# Patient Record
Sex: Female | Born: 1970
Health system: Southern US, Community
[De-identification: ages and names within clinical notes are randomized; demographics above are authoritative.]

## PROBLEM LIST (undated history)

## (undated) DIAGNOSIS — N951 Menopausal and female climacteric states: Secondary | ICD-10-CM

## (undated) DIAGNOSIS — G43009 Migraine without aura, not intractable, without status migrainosus: Secondary | ICD-10-CM

## (undated) DIAGNOSIS — F5104 Psychophysiologic insomnia: Secondary | ICD-10-CM

## (undated) HISTORY — DX: Psychophysiologic insomnia: F51.04

## (undated) HISTORY — PX: OTHER SURGICAL HISTORY: SHX169

## (undated) HISTORY — PX: TUBAL LIGATION: SHX77

## (undated) HISTORY — DX: Menopausal and female climacteric states: N95.1

## (undated) HISTORY — DX: Migraine without aura, not intractable, without status migrainosus: G43.009

---

## 1999-01-26 ENCOUNTER — Other Ambulatory Visit: Admission: RE | Admit: 1999-01-26 | Discharge: 1999-01-26 | Payer: Self-pay | Admitting: Obstetrics and Gynecology

## 2000-02-03 ENCOUNTER — Other Ambulatory Visit: Admission: RE | Admit: 2000-02-03 | Discharge: 2000-02-03 | Payer: Self-pay | Admitting: Obstetrics and Gynecology

## 2001-03-02 ENCOUNTER — Other Ambulatory Visit: Admission: RE | Admit: 2001-03-02 | Discharge: 2001-03-02 | Payer: Self-pay | Admitting: Obstetrics and Gynecology

## 2002-02-19 ENCOUNTER — Other Ambulatory Visit: Admission: RE | Admit: 2002-02-19 | Discharge: 2002-02-19 | Payer: Self-pay | Admitting: Obstetrics and Gynecology

## 2004-05-22 ENCOUNTER — Emergency Department (HOSPITAL_COMMUNITY): Admission: EM | Admit: 2004-05-22 | Discharge: 2004-05-22 | Payer: Self-pay | Admitting: Emergency Medicine

## 2006-11-14 ENCOUNTER — Emergency Department (HOSPITAL_COMMUNITY): Admission: EM | Admit: 2006-11-14 | Discharge: 2006-11-14 | Payer: Self-pay | Admitting: Emergency Medicine

## 2007-04-27 ENCOUNTER — Emergency Department (HOSPITAL_COMMUNITY): Admission: EM | Admit: 2007-04-27 | Discharge: 2007-04-27 | Payer: Self-pay | Admitting: Emergency Medicine

## 2008-10-03 ENCOUNTER — Emergency Department (HOSPITAL_COMMUNITY): Admission: EM | Admit: 2008-10-03 | Discharge: 2008-10-03 | Payer: Self-pay | Admitting: Emergency Medicine

## 2009-04-11 LAB — HM MAMMOGRAPHY: HM Mammogram: NORMAL

## 2009-10-26 ENCOUNTER — Emergency Department (HOSPITAL_COMMUNITY): Admission: EM | Admit: 2009-10-26 | Discharge: 2009-10-26 | Payer: Self-pay | Admitting: Family Medicine

## 2010-05-13 ENCOUNTER — Ambulatory Visit: Payer: Self-pay | Admitting: Obstetrics and Gynecology

## 2010-06-26 LAB — POCT URINALYSIS DIP (DEVICE)
Protein, ur: 30 mg/dL — AB
Urobilinogen, UA: 0.2 mg/dL (ref 0.0–1.0)
pH: 6 (ref 5.0–8.0)

## 2010-06-26 LAB — WET PREP, GENITAL
Trich, Wet Prep: NONE SEEN
Yeast Wet Prep HPF POC: NONE SEEN

## 2010-12-30 LAB — COMPREHENSIVE METABOLIC PANEL
ALT: 18
Alkaline Phosphatase: 40
BUN: 13
CO2: 27
Chloride: 107
GFR calc non Af Amer: >60
Glucose, Bld: 100 — ABNORMAL HIGH
Potassium: 3.9
Sodium: 139
Total Bilirubin: 1.2

## 2010-12-30 LAB — URINALYSIS, ROUTINE W REFLEX MICROSCOPIC
Ketones, ur: NEGATIVE
Nitrite: NEGATIVE
Protein, ur: NEGATIVE
pH: 6

## 2010-12-30 LAB — URINE MICROSCOPIC-ADD ON

## 2010-12-30 LAB — DIFFERENTIAL
Basophils Absolute: 0
Basophils Relative: 1
Eosinophils Absolute: 0
Eosinophils Relative: 1
Lymphs Abs: 0.5 — ABNORMAL LOW

## 2010-12-30 LAB — CBC
Hemoglobin: 11.1 — ABNORMAL LOW
RBC: 3.63 — ABNORMAL LOW
WBC: 6.5

## 2010-12-30 LAB — PREGNANCY, URINE

## 2010-12-30 LAB — URINE CULTURE: Colony Count: 85000

## 2010-12-30 LAB — LIPASE, BLOOD: Lipase: 30

## 2011-04-12 LAB — HM PAP SMEAR: HM PAP: NORMAL

## 2014-11-28 ENCOUNTER — Ambulatory Visit (INDEPENDENT_AMBULATORY_CARE_PROVIDER_SITE_OTHER): Payer: Federal, State, Local not specified - PPO | Admitting: Family Medicine

## 2014-11-28 VITALS — BP 100/60 | HR 86 | Temp 98.7°F | Resp 14 | Ht 66.0 in | Wt 138.0 lb

## 2014-11-28 DIAGNOSIS — R300 Dysuria: Secondary | ICD-10-CM

## 2014-11-28 LAB — POCT URINALYSIS DIPSTICK
Bilirubin, UA: NEGATIVE
Glucose, UA: NEGATIVE
Nitrite, UA: NEGATIVE
PH UA: 7
PROTEIN UA: NEGATIVE
RBC UA: NEGATIVE
SPEC GRAV UA: 1.02
UROBILINOGEN UA: 1

## 2014-11-28 LAB — POCT UA - MICROSCOPIC ONLY
CASTS, UR, LPF, POC: NEGATIVE
Crystals, Ur, HPF, POC: NEGATIVE
MUCUS UA: NEGATIVE
RBC, urine, microscopic: NEGATIVE
YEAST UA: NEGATIVE

## 2014-11-28 MED ORDER — CIPROFLOXACIN HCL 500 MG PO TABS: 500.0000 mg | ORAL_TABLET | Freq: Two times a day (BID) | ORAL | Status: DC

## 2014-11-28 NOTE — Progress Notes (Addendum)
Subjective:    Patient ID: Tammy Evans, female    DOB: 1971/02/14, 44 y.o.   MRN: [id number]  HPI    Review of Systems     Objective:   Physical Exam        Assessment & Plan:   [facility] [address], [place] [address] [phone]  Date:  11/28/2014   Name:  Tammy Evans   DOB:  27-Apr-1970   MRN:  [id number]  PCP:  Ruel Favors, MD    Chief Complaint: Urinary Tract Infection   History of Present Illness:  Tammy Evans is a 44 y.o. very pleasant female patient who presents with the following:  She has noted left lower back pain off and on for a couple of weeks, and urinary frequency for about 2 weeks. She has noted mild dysuria off and on No blood in her urine No fever, no belly pain No vaginal sx She is generally in good health  She is eating normally  LMP 11/06/14- she is s/p BTL  There are no active problems to display for this patient.   History reviewed. No pertinent past medical history.  Past Surgical History  Procedure Laterality Date  . Cesarean section      Social History  Substance Use Topics  . Smoking status: Never Smoker   . Smokeless tobacco: None  . Alcohol Use: No    Family History  Problem Relation Age of Onset  . Diabetes Father   . Heart disease Father   . Hypertension Father     No Known Allergies  Medication list has been reviewed and updated.  No current outpatient prescriptions on file prior to visit.   No current facility-administered medications on file prior to visit.    Review of Systems:  As per HPI- otherwise negative.   Physical Examination: Filed Vitals:   11/28/14 1717  BP: 100/60  Pulse: 86  Temp: 99 F (37.2 C)  Resp: 14   Filed Vitals:   11/28/14 1717  Height: 5\' 6"  (1.676 m)  Weight: 138 lb (62.596 kg)   Body mass index is 22.28 kg/(m^2). Ideal Body Weight: Weight in (lb) to have BMI = 25: 154.6  GEN: WDWN, NAD, Non-toxic, A & O x  3 HEENT: Atraumatic, Normocephalic. Neck supple. No masses, No LAD. Ears and Nose: No external deformity. CV: RRR, No M/G/R. No JVD. No thrill. No extra heart sounds. PULM: CTA B, no wheezes, crackles, rhonchi. No retractions. No resp. distress. No accessory muscle use. ABD: S, NT, ND, +BS. No rebound. No HSM. Mild left sided CVA tenderness  EXTR: No c/c/e NEURO Normal gait.  PSYCH: Normally interactive. Conversant. Not depressed or anxious appearing.  Calm demeanor.   Results for orders placed or performed in visit on 11/28/14  POCT urinalysis dipstick  Result Value Ref Range   Color, UA yellow    Clarity, UA clear    Glucose, UA neg    Bilirubin, UA neg    Ketones, UA trace    Spec Grav, UA 1.020    Blood, UA neg    pH, UA 7.0    Protein, UA neg    Urobilinogen, UA 1.0    Nitrite, UA neg    Leukocytes, UA small (1+) (A) Negative  POCT UA - Microscopic Only  Result Value Ref Range   WBC, Ur, HPF, POC 4-6    RBC, urine, microscopic neg    Bacteria, U Microscopic  3+    Mucus, UA neg    Epithelial cells, urine per micros 4-6    Crystals, Ur, HPF, POC neg    Casts, Ur, LPF, POC neg    Yeast, UA neg     Assessment and Plan: Dysuria - Plan: POCT urinalysis dipstick, POCT UA - Microscopic Only, Urine culture, ciprofloxacin (CIPRO) 500 MG tablet  Treat for UTI- will use 500 mg cipro as she has some back pain- could be early pyelo Await culture, she will let me know if not better soon  Meds ordered this encounter  Medications  . ciprofloxacin (CIPRO) 500 MG tablet    Sig: Take 1 tablet (500 mg total) by mouth 2 (two) times daily.    Dispense:  10 tablet    Refill:  0   Signed Abbe Amsterdam, MD  Called with her urine culture results 8/21- LMOM. Mixed urine culture- let me know if not feeling better.  However if she is feeling better UTI is more likely, continue abx   Results for orders placed or performed in visit on 11/28/14  Urine culture  Result Value Ref Range    Colony Count 50,000 COLONIES/ML    Organism ID, Bacteria Multiple bacterial morphotypes present, none    Organism ID, Bacteria predominant. Suggest appropriate recollection if     Organism ID, Bacteria clinically indicated.   POCT urinalysis dipstick  Result Value Ref Range   Color, UA yellow    Clarity, UA clear    Glucose, UA neg    Bilirubin, UA neg    Ketones, UA trace    Spec Grav, UA 1.020    Blood, UA neg    pH, UA 7.0    Protein, UA neg    Urobilinogen, UA 1.0    Nitrite, UA neg    Leukocytes, UA small (1+) (A) Negative  POCT UA - Microscopic Only  Result Value Ref Range   WBC, Ur, HPF, POC 4-6    RBC, urine, microscopic neg    Bacteria, U Microscopic 3+    Mucus, UA neg    Epithelial cells, urine per micros 4-6    Crystals, Ur, HPF, POC neg    Casts, Ur, LPF, POC neg    Yeast, UA neg

## 2014-11-28 NOTE — Patient Instructions (Signed)
We are going to treat you for a UTI with cipro (antibiotic) Please let us know if you are not feeling better in the next 1-2 days I will be in touch if your urine culture shows any problem

## 2014-11-29 LAB — URINE CULTURE

## 2014-11-30 ENCOUNTER — Encounter: Payer: Self-pay | Admitting: Family Medicine

## 2015-01-13 ENCOUNTER — Ambulatory Visit (INDEPENDENT_AMBULATORY_CARE_PROVIDER_SITE_OTHER): Payer: Federal, State, Local not specified - PPO | Admitting: Family Medicine

## 2015-01-13 ENCOUNTER — Encounter: Payer: Self-pay | Admitting: Family Medicine

## 2015-01-13 ENCOUNTER — Encounter (INDEPENDENT_AMBULATORY_CARE_PROVIDER_SITE_OTHER): Payer: Self-pay

## 2015-01-13 VITALS — BP 106/64 | HR 74 | Temp 98.3°F | Resp 16 | Ht 66.0 in | Wt 138.9 lb

## 2015-01-13 DIAGNOSIS — Z79899 Other long term (current) drug therapy: Secondary | ICD-10-CM | POA: Diagnosis not present

## 2015-01-13 DIAGNOSIS — Z114 Encounter for screening for human immunodeficiency virus [HIV]: Secondary | ICD-10-CM | POA: Diagnosis not present

## 2015-01-13 DIAGNOSIS — Z124 Encounter for screening for malignant neoplasm of cervix: Secondary | ICD-10-CM

## 2015-01-13 DIAGNOSIS — Z01419 Encounter for gynecological examination (general) (routine) without abnormal findings: Secondary | ICD-10-CM

## 2015-01-13 DIAGNOSIS — Z0000 Encounter for general adult medical examination without abnormal findings: Secondary | ICD-10-CM | POA: Diagnosis not present

## 2015-01-13 DIAGNOSIS — Z13220 Encounter for screening for lipoid disorders: Secondary | ICD-10-CM | POA: Diagnosis not present

## 2015-01-13 DIAGNOSIS — R61 Generalized hyperhidrosis: Secondary | ICD-10-CM | POA: Diagnosis not present

## 2015-01-13 DIAGNOSIS — G4709 Other insomnia: Secondary | ICD-10-CM | POA: Insufficient documentation

## 2015-01-13 DIAGNOSIS — Z1239 Encounter for other screening for malignant neoplasm of breast: Secondary | ICD-10-CM

## 2015-01-13 DIAGNOSIS — N6029 Fibroadenosis of unspecified breast: Secondary | ICD-10-CM | POA: Diagnosis not present

## 2015-01-13 DIAGNOSIS — Z23 Encounter for immunization: Secondary | ICD-10-CM

## 2015-01-13 DIAGNOSIS — G4726 Circadian rhythm sleep disorder, shift work type: Secondary | ICD-10-CM | POA: Insufficient documentation

## 2015-01-13 DIAGNOSIS — G4700 Insomnia, unspecified: Secondary | ICD-10-CM | POA: Insufficient documentation

## 2015-01-13 DIAGNOSIS — G43009 Migraine without aura, not intractable, without status migrainosus: Secondary | ICD-10-CM | POA: Insufficient documentation

## 2015-01-13 NOTE — Patient Instructions (Signed)
Discussed importance of 150 minutes of physical activity weekly, eat two servings of fish weekly, eat one serving of tree nuts ( cashews, pistachios, pecans, almonds..) every other day, eat 6 servings of fruit/vegetables daily and drink plenty of water and avoid sweet beverages. 

## 2015-01-13 NOTE — Progress Notes (Signed)
Name: Tammy Evans   MRN: [id number]    DOB: December 13, 1970   Date:01/13/2015       Progress Note  Subjective  Chief Complaint  Chief Complaint  Patient presents with  . Annual Exam    HPI  Well Woman:  Denies urinary problems, no vaginal discharge, no dyspareunia, no nipple discharge or lumps. Feeling well excetp for some indigestion, but she wants to try otc mediations and return to discuss it at another visit  Patient Active Problem List   Diagnosis Date Noted  . Migraine without aura and responsive to treatment 01/13/2015  . Insomnia, persistent 01/13/2015  . Night sweats 01/13/2015  . Shift work sleep disorder 01/13/2015    Past Surgical History  Procedure Laterality Date  . Cesarean section    . Tubal ligation    . Culposcopy      Family History  Problem Relation Age of Onset  . Diabetes Father   . Heart disease Father   . Hypertension Father   . Drug abuse Sister   . Sickle cell trait Daughter     Social History   Social History  . Marital Status: Single    Spouse Name: N/A  . Number of Children: N/A  . Years of Education: N/A   Occupational History  . Not on file.   Social History Main Topics  . Smoking status: Never Smoker   . Smokeless tobacco: Never Used  . Alcohol Use: 0.0 oz/week    0 Standard drinks or equivalent per week     Comment: occasionally  . Drug Use: No  . Sexual Activity: Yes   Other Topics Concern  . Not on file   Social History Narrative     Current outpatient prescriptions:  .  SUMAtriptan (IMITREX) 100 MG tablet, Take by mouth., Disp: , Rfl:  .  temazepam (RESTORIL) 15 MG capsule, Take by mouth., Disp: , Rfl:   No Known Allergies   ROS  Constitutional: Negative for fever or weight change.  Respiratory: Negative for cough and shortness of breath.   Cardiovascular: Negative for chest pain or palpitations.  Gastrointestinal: Intermittent  abdominal pain, no bowel changes.  Musculoskeletal: Negative for gait problem  or joint swelling.  Skin: Negative for rash.  Neurological: Negative for dizziness or headache.  No other specific complaints in a complete review of systems (except as listed in HPI above).  Objective  Filed Vitals:   01/13/15 1432  BP: 106/64  Pulse: 74  Temp: 98.3 F (36.8 C)  TempSrc: Oral  Resp: 16  Height:  (1.676 m)  Weight: 138 lb 14.4 oz (63.005 kg)  SpO2: 98%    Body mass index is 22.43 kg/(m^2).  Physical Exam   Constitutional: Patient appears well-developed and well-nourished. No distress.  HENT: Head: Normocephalic and atraumatic. Ears: B TMs ok, no erythema or effusion; Nose: Nose normal. Mouth/Throat: Oropharynx is clear and moist. No oropharyngeal exudate.  Eyes: Conjunctivae and EOM are normal. Pupils are equal, round, and reactive to light. No scleral icterus.  Neck: Normal range of motion. Neck supple. No JVD present. No thyromegaly present.  Cardiovascular: Normal rate, regular rhythm and normal heart sounds.  No murmur heard. No BLE edema. Pulmonary/Chest: Effort normal and breath sounds normal. No respiratory distress. Abdominal: Soft. Bowel sounds are normal, no distension. There is no tenderness. no masses Breast: lumpy throughout  or masses, no nipple discharge or rashes FEMALE GENITALIA:  External genitalia normal External urethra normal Vaginal vault normal without discharge  or lesions Cervix normal without discharge or lesions Bimanual exam normal without masses RECTAL: not done Musculoskeletal: Normal range of motion, no joint effusions. No gross deformities Neurological: he is alert and oriented to person, place, and time. No cranial nerve deficit. Coordination, balance, strength, speech and gait are normal.  Skin: Skin is warm and dry. No rash noted. No erythema.  Psychiatric: Patient has a normal mood and affect. behavior is normal. Judgment and thought content normal.  Recent Results (from the past 2160 hour(s))  POCT urinalysis  dipstick     Status: Abnormal   Collection Time: 11/28/14  5:48 PM  Result Value Ref Range   Color, UA yellow    Clarity, UA clear    Glucose, UA neg    Bilirubin, UA neg    Ketones, UA trace    Spec Grav, UA 1.020    Blood, UA neg    pH, UA 7.0    Protein, UA neg    Urobilinogen, UA 1.0    Nitrite, UA neg    Leukocytes, UA small (1+) (A) Negative  POCT UA - Microscopic Only     Status: None   Collection Time: 11/28/14  5:48 PM  Result Value Ref Range   WBC, Ur, HPF, POC 4-6    RBC, urine, microscopic neg    Bacteria, U Microscopic 3+    Mucus, UA neg    Epithelial cells, urine per micros 4-6    Crystals, Ur, HPF, POC neg    Casts, Ur, LPF, POC neg    Yeast, UA neg   Urine culture     Status: None   Collection Time: 11/28/14  5:48 PM  Result Value Ref Range   Colony Count 50,000 COLONIES/ML    Organism ID, Bacteria Multiple bacterial morphotypes present, none    Organism ID, Bacteria predominant. Suggest appropriate recollection if     Organism ID, Bacteria clinically indicated.       PHQ2/9: Depression screen [facility] 2/9 01/13/2015 11/28/2014  Decreased Interest 0 0  Down, Depressed, Hopeless 0 0  PHQ - 2 Score 0 0     Fall Risk: Fall Risk  01/13/2015  Falls in the past year? No     Functional Status Survey: Is the patient deaf or have difficulty hearing?: No Does the patient have difficulty seeing, even when wearing glasses/contacts?: Yes (contacts) Does the patient have difficulty concentrating, remembering, or making decisions?: No Does the patient have difficulty walking or climbing stairs?: No Does the patient have difficulty dressing or bathing?: No Does the patient have difficulty doing errands alone such as visiting a doctor's office or shopping?: No    Assessment & Plan  1. Well woman exam  Discussed importance of 150 minutes of physical activity weekly, eat two servings of fish weekly, eat one serving of tree nuts ( cashews, pistachios, pecans,  almonds.Marland Kitchen) every other day, eat 6 servings of fruit/vegetables daily and drink plenty of water and avoid sweet beverages.   2. Night sweats  - CBC with Differential/Platelet - TSH  3. Cervical cancer screening  - PapLb, HPV, rfx16/18  4. Breast cancer screening  -she will have it done at work  5. Screening for HIV (human immunodeficiency virus)  - HIV antibody  6. Needs flu shot  - Flu Vaccine QUAD 36+ mos IM -refused  7. Lipid screening  - Lipid panel  8. Long-term use of high-risk medication  - Comprehensive metabolic panel  9. Lumpy breasts, unspecified laterality  Possible fibrocystic breast  disease, but no pain, she will get mammogram through work this month and call back if needed.

## 2015-01-16 LAB — COMPREHENSIVE METABOLIC PANEL
ALT: 12 IU/L (ref 0–32)
AST: 15 IU/L (ref 0–40)
Albumin/Globulin Ratio: 1.6 (ref 1.1–2.5)
Albumin: 4.2 g/dL (ref 3.5–5.5)
Alkaline Phosphatase: 48 IU/L (ref 39–117)
BUN/Creatinine Ratio: 14 (ref 9–23)
BUN: 13 mg/dL (ref 6–24)
Bilirubin Total: 1.1 mg/dL (ref 0.0–1.2)
CALCIUM: 9.1 mg/dL (ref 8.7–10.2)
CHLORIDE: 101 mmol/L (ref 97–108)
CO2: 24 mmol/L (ref 18–29)
CREATININE: 0.94 mg/dL (ref 0.57–1.00)
GFR, EST AFRICAN AMERICAN: 84 mL/min/{1.73_m2} (ref >59)
GFR, EST NON AFRICAN AMERICAN: 73 mL/min/{1.73_m2} (ref >59)
GLUCOSE: 86 mg/dL (ref 65–99)
Globulin, Total: 2.6 g/dL (ref 1.5–4.5)
Potassium: 4 mmol/L (ref 3.5–5.2)
Sodium: 138 mmol/L (ref 134–144)
TOTAL PROTEIN: 6.8 g/dL (ref 6.0–8.5)

## 2015-01-16 LAB — CBC WITH DIFFERENTIAL/PLATELET
BASOS ABS: 0 10*3/uL (ref 0.0–0.2)
Basos: 0 %
EOS (ABSOLUTE): 0.1 10*3/uL (ref 0.0–0.4)
Eos: 2 %
HEMOGLOBIN: 12.8 g/dL (ref 11.1–15.9)
Hematocrit: 38.5 % (ref 34.0–46.6)
IMMATURE GRANS (ABS): 0 10*3/uL (ref 0.0–0.1)
IMMATURE GRANULOCYTES: 0 %
LYMPHS: 24 %
Lymphocytes Absolute: 1.1 10*3/uL (ref 0.7–3.1)
MCH: 30.2 pg (ref 26.6–33.0)
MCHC: 33.2 g/dL (ref 31.5–35.7)
MCV: 91 fL (ref 79–97)
MONOCYTES: 11 %
Monocytes Absolute: 0.5 10*3/uL (ref 0.1–0.9)
NEUTROS PCT: 63 %
Neutrophils Absolute: 3 10*3/uL (ref 1.4–7.0)
PLATELETS: 138 10*3/uL — AB (ref 150–379)
RBC: 4.24 x10E6/uL (ref 3.77–5.28)
RDW: 14.2 % (ref 12.3–15.4)
WBC: 4.7 10*3/uL (ref 3.4–10.8)

## 2015-01-16 LAB — LIPID PANEL
CHOL/HDL RATIO: 3 ratio (ref 0.0–4.4)
Cholesterol, Total: 189 mg/dL (ref 100–199)
HDL: 64 mg/dL (ref >39)
LDL CALC: 109 mg/dL — AB (ref 0–99)
TRIGLYCERIDES: 79 mg/dL (ref 0–149)
VLDL CHOLESTEROL CAL: 16 mg/dL (ref 5–40)

## 2015-01-16 LAB — HIV ANTIBODY (ROUTINE TESTING W REFLEX): HIV Screen 4th Generation wRfx: NONREACTIVE

## 2015-01-16 LAB — TSH: TSH: 0.949 u[IU]/mL (ref 0.450–4.500)

## 2015-01-20 LAB — PAPLB, HPV, RFX16/18: PAP SMEAR COMMENT: 0

## 2015-01-20 NOTE — Progress Notes (Signed)
Left voice mail

## 2015-01-20 NOTE — Progress Notes (Signed)
Patient notified

## 2015-11-10 ENCOUNTER — Telehealth: Payer: Self-pay

## 2015-11-10 ENCOUNTER — Ambulatory Visit (INDEPENDENT_AMBULATORY_CARE_PROVIDER_SITE_OTHER): Payer: Federal, State, Local not specified - PPO | Admitting: Family Medicine

## 2015-11-10 ENCOUNTER — Encounter: Payer: Self-pay | Admitting: Family Medicine

## 2015-11-10 VITALS — Temp 97.6°F | Resp 16 | Ht 66.0 in | Wt 141.7 lb

## 2015-11-10 DIAGNOSIS — R350 Frequency of micturition: Secondary | ICD-10-CM

## 2015-11-10 DIAGNOSIS — F331 Major depressive disorder, recurrent, moderate: Secondary | ICD-10-CM | POA: Diagnosis not present

## 2015-11-10 DIAGNOSIS — F411 Generalized anxiety disorder: Secondary | ICD-10-CM | POA: Insufficient documentation

## 2015-11-10 DIAGNOSIS — M545 Low back pain, unspecified: Secondary | ICD-10-CM

## 2015-11-10 DIAGNOSIS — G43009 Migraine without aura, not intractable, without status migrainosus: Secondary | ICD-10-CM

## 2015-11-10 DIAGNOSIS — Z833 Family history of diabetes mellitus: Secondary | ICD-10-CM

## 2015-11-10 DIAGNOSIS — M79605 Pain in left leg: Secondary | ICD-10-CM

## 2015-11-10 DIAGNOSIS — Z113 Encounter for screening for infections with a predominantly sexual mode of transmission: Secondary | ICD-10-CM | POA: Diagnosis not present

## 2015-11-10 LAB — POCT URINALYSIS DIPSTICK
Bilirubin, UA: NEGATIVE
Blood, UA: NEGATIVE
Glucose, UA: NEGATIVE
KETONES UA: NEGATIVE
LEUKOCYTES UA: NEGATIVE
NITRITE UA: NEGATIVE
PH UA: 6
PROTEIN UA: NEGATIVE
Spec Grav, UA: 1.015
Urobilinogen, UA: 0.2

## 2015-11-10 LAB — POCT GLYCOSYLATED HEMOGLOBIN (HGB A1C): HEMOGLOBIN A1C: 5.4

## 2015-11-10 MED ORDER — ESCITALOPRAM OXALATE 10 MG PO TABS: 10.0000 mg | ORAL_TABLET | Freq: Every day | ORAL | 0 refills | Status: DC

## 2015-11-10 MED ORDER — SUMATRIPTAN SUCCINATE 100 MG PO TABS: 100.0000 mg | ORAL_TABLET | Freq: Once | ORAL | 2 refills | Status: DC

## 2015-11-10 MED ORDER — PREDNISONE 10 MG (48) PO TBPK: ORAL_TABLET | Freq: Every day | ORAL | 0 refills | Status: DC

## 2015-11-10 MED ORDER — TOPIRAMATE 25 MG PO TABS: 25.0000 mg | ORAL_TABLET | Freq: Two times a day (BID) | ORAL | 0 refills | Status: DC

## 2015-11-10 NOTE — Progress Notes (Signed)
Name: Tammy Evans   MRN: [id number]    DOB: 1970/04/21   Date:11/10/2015       Progress Note  Subjective  Chief Complaint  Chief Complaint  Patient presents with  . Medication Refill  . Migraine    Patient states she has been very stressed out and has ran out of medication. She has had 3-4 migraines monthly  . Back Pain    Onset-3 days, Left Flank pain, very tender and sore, patient also states she is experiencing urinary frequency with her flank pain and would like her urine checked.   . Anxiety    Patient states she has alot going on and to deal with and would like to talk about start medication due to stress    HPI  GAD/Major Depression: she has a history of depression and anxiety. She does not like to take medications, but states she is very stressed out now. Her boyfriend ( they live together ) is doing drugs - she found cocaine in their house ( he denied ), he is always gone and she knows he is doing drugs. She is getting very worried about it. She had a panic attack last week. She is feeling tired and snappy, migraines more often. Can't afford moving out of the house now, but making plans to be able to get her own place. Sister is in [place] but can't get a job transfer because she has missed too many days of work secondary to migraine  Migraine headaches: she states it is initially on right frontal area, sharp like and improves when she takes [facility] powders and sleeps. She has nausea and photophobia and phonophobia.   Back pain with radiculitis: she developed acute onset of left lower back pain that radiates up and down her leg ( lateral aspect ), pain is aggravated by movement and better with rest - but it causes worsening of stiffness. She works at [employer] and has to pull and lift objects all day.   Urinary frequency: she had a tubal ligation, uses condoms, but over the past month, she has noticed episode of urinary frequency, no dysuria , vaginal discharge on hematuria.      Patient Active Problem List   Diagnosis Date Noted  . GAD (generalized anxiety disorder) 11/10/2015  . Migraine without aura and responsive to treatment 01/13/2015  . Insomnia, persistent 01/13/2015  . Night sweats 01/13/2015  . Shift work sleep disorder 01/13/2015    Past Surgical History:  Procedure Laterality Date  . CESAREAN SECTION    . culposcopy    . TUBAL LIGATION      Family History  Problem Relation Age of Onset  . Diabetes Father   . Heart disease Father   . Hypertension Father   . Drug abuse Sister   . Sickle cell trait Daughter     Social History   Social History  . Marital status: Single    Spouse name: N/A  . Number of children: N/A  . Years of education: N/A   Occupational History  . Not on file.   Social History Main Topics  . Smoking status: Never Smoker  . Smokeless tobacco: Never Used  . Alcohol use 0.0 oz/week     Comment: occasionally  . Drug use: No  . Sexual activity: Yes    Birth control/ protection: Condom     Comment: Tubal Ligation    Other Topics Concern  . Not on file   Social History Narrative  . No  narrative on file     Current Outpatient Prescriptions:  .  SUMAtriptan (IMITREX) 100 MG tablet, Take 1 tablet (100 mg total) by mouth once., Disp: 10 tablet, Rfl: 2 .  escitalopram (LEXAPRO) 10 MG tablet, Take 1 tablet (10 mg total) by mouth daily., Disp: 30 tablet, Rfl: 0 .  predniSONE (STERAPRED UNI-PAK 48 TAB) 10 MG (48) TBPK tablet, Take by mouth daily., Disp: 48 tablet, Rfl: 0 .  topiramate (TOPAMAX) 25 MG tablet, Take 1-4 tablets (25-100 mg total) by mouth 2 (two) times daily., Disp: 100 tablet, Rfl: 0  No Known Allergies   ROS  Ten systems reviewed and is negative except as mentioned in HPI   Objective  Vitals:   11/10/15 1535  Resp: 16  Temp: 97.6 F (36.4 C)  TempSrc: Oral  SpO2: 99%  Weight: 141 lb 11.2 oz (64.3 kg)  Height:  (1.676 m)    Body mass index is 22.87 kg/m.  Physical  Exam  Constitutional: Patient appears well-developed and well-nourished.  In mild  Distress when moving around  HEENT: head atraumatic, normocephalic, pupils equal and reactive to light,  neck supple, throat within normal limits Cardiovascular: Normal rate, regular rhythm and normal heart sounds.  No murmur heard. No BLE edema. Pulmonary/Chest: Effort normal and breath sounds normal. No respiratory distress. Abdominal: Soft.  There is no tenderness. Negative CVA tenderness Psychiatric: Patient has a normal mood and affect. behavior is normal. Judgment and thought content normal. Muscular Skeletal: pain with palpation of left lower back, negative straight leg raise, decrease rom of back in all directions.   Recent Results (from the past 2160 hour(s))  POCT urinalysis dipstick     Status: Normal   Collection Time: 11/10/15  3:44 PM  Result Value Ref Range   Color, UA yellow    Clarity, UA clear    Glucose, UA neg    Bilirubin, UA neg    Ketones, UA neg    Spec Grav, UA 1.015    Blood, UA neg    pH, UA 6.0    Protein, UA neg    Urobilinogen, UA 0.2    Nitrite, UA neg    Leukocytes, UA Negative Negative     PHQ2/9: Depression screen [facility] 2/9 11/10/2015 01/13/2015 11/28/2014  Decreased Interest 2 0 0  Down, Depressed, Hopeless 1 0 0  PHQ - 2 Score 3 0 0  Altered sleeping 3 - -  Tired, decreased energy 1 - -  Change in appetite 0 - -  Feeling bad or failure about yourself  0 - -  Trouble concentrating 2 - -  Moving slowly or fidgety/restless 2 - -  Suicidal thoughts 0 - -  PHQ-9 Score 11 - -  Difficult doing work/chores Somewhat difficult - -    Fall Risk: Fall Risk  11/10/2015 01/13/2015  Falls in the past year? No No      Functional Status Survey: Is the patient deaf or have difficulty hearing?: No Does the patient have difficulty seeing, even when wearing glasses/contacts?: No Does the patient have difficulty concentrating, remembering, or making decisions?: No Does the  patient have difficulty walking or climbing stairs?: No Does the patient have difficulty dressing or bathing?: No Does the patient have difficulty doing errands alone such as visiting a doctor's office or shopping?: No    Assessment & Plan  1. Urinary frequency  - POCT urinalysis dipstick - CULTURE, URINE COMPREHENSIVE - Chlamydia/Gonococcus/Trichomonas, NAA - hgbA1C  2. Low back pain radiating  to left lower extremity  - POCT urinalysis dipstick - predniSONE (STERAPRED UNI-PAK 48 TAB) 10 MG (48) TBPK tablet; Take by mouth daily.  Dispense: 48 tablet; Refill: 0  3. Migraine without aura and responsive to treatment  - SUMAtriptan (IMITREX) 100 MG tablet; Take 1 tablet (100 mg total) by mouth once.  Dispense: 10 tablet; Refill: 2 - topiramate (TOPAMAX) 25 MG tablet; Take 1-4 tablets (25-100 mg total) by mouth 2 (two) times daily.  Dispense: 100 tablet; Refill: 0  4. GAD (generalized anxiety disorder)  - escitalopram (LEXAPRO) 10 MG tablet; Take 1 tablet (10 mg total) by mouth daily.  Dispense: 30 tablet; Refill: 0  5. Routine screening for STI (sexually transmitted infection)  - Chlamydia/Gonococcus/Trichomonas, NAA - RPR - HIV antibody

## 2015-11-10 NOTE — Telephone Encounter (Signed)
Patient requesting refill. Valtrex Refill

## 2015-11-11 ENCOUNTER — Other Ambulatory Visit: Payer: Self-pay | Admitting: Family Medicine

## 2015-11-11 DIAGNOSIS — R350 Frequency of micturition: Secondary | ICD-10-CM | POA: Diagnosis not present

## 2015-11-11 DIAGNOSIS — Z113 Encounter for screening for infections with a predominantly sexual mode of transmission: Secondary | ICD-10-CM | POA: Diagnosis not present

## 2015-11-11 LAB — RPR: RPR: NONREACTIVE

## 2015-11-11 LAB — HIV ANTIBODY (ROUTINE TESTING W REFLEX): HIV Screen 4th Generation wRfx: NONREACTIVE

## 2015-11-11 MED ORDER — VALACYCLOVIR HCL 500 MG PO TABS: 500.0000 mg | ORAL_TABLET | Freq: Every day | ORAL | 0 refills | Status: DC

## 2015-11-13 ENCOUNTER — Ambulatory Visit: Payer: Federal, State, Local not specified - PPO

## 2015-11-13 LAB — CHLAMYDIA/GONOCOCCUS/TRICHOMONAS, NAA
Chlamydia by NAA: NEGATIVE
Gonococcus by NAA: NEGATIVE
Trich vag by NAA: NEGATIVE

## 2015-11-13 LAB — PLEASE NOTE

## 2015-11-15 LAB — PLEASE NOTE

## 2015-11-15 LAB — CULTURE, URINE COMPREHENSIVE

## 2015-11-16 ENCOUNTER — Other Ambulatory Visit: Payer: Self-pay | Admitting: Family Medicine

## 2015-11-16 MED ORDER — CIPROFLOXACIN HCL 250 MG PO TABS: 250.0000 mg | ORAL_TABLET | Freq: Two times a day (BID) | ORAL | 0 refills | Status: DC

## 2015-11-18 ENCOUNTER — Telehealth: Payer: Self-pay

## 2015-11-18 NOTE — Telephone Encounter (Signed)
Left voicemail for patient to return my call for lab results.

## 2015-11-18 NOTE — Telephone Encounter (Signed)
-----   Message from Alba CoryKrichna Sowles, MD sent at 11/16/2015  7:48 PM EDT ----- She has a mild UTI, negative for genprobe Negative test for HIV and syphillis

## 2015-12-10 ENCOUNTER — Other Ambulatory Visit: Payer: Self-pay | Admitting: Family Medicine

## 2015-12-10 DIAGNOSIS — F411 Generalized anxiety disorder: Secondary | ICD-10-CM

## 2016-01-05 ENCOUNTER — Ambulatory Visit (INDEPENDENT_AMBULATORY_CARE_PROVIDER_SITE_OTHER): Payer: Federal, State, Local not specified - PPO | Admitting: Family Medicine

## 2016-01-05 ENCOUNTER — Encounter: Payer: Self-pay | Admitting: Family Medicine

## 2016-01-05 VITALS — BP 104/68 | HR 80 | Temp 98.0°F | Resp 16 | Ht 66.0 in | Wt 140.5 lb

## 2016-01-05 DIAGNOSIS — G43009 Migraine without aura, not intractable, without status migrainosus: Secondary | ICD-10-CM | POA: Diagnosis not present

## 2016-01-05 DIAGNOSIS — D696 Thrombocytopenia, unspecified: Secondary | ICD-10-CM | POA: Insufficient documentation

## 2016-01-05 DIAGNOSIS — Z1239 Encounter for other screening for malignant neoplasm of breast: Secondary | ICD-10-CM | POA: Diagnosis not present

## 2016-01-05 DIAGNOSIS — Z0000 Encounter for general adult medical examination without abnormal findings: Secondary | ICD-10-CM

## 2016-01-05 DIAGNOSIS — Z124 Encounter for screening for malignant neoplasm of cervix: Secondary | ICD-10-CM

## 2016-01-05 DIAGNOSIS — Z01419 Encounter for gynecological examination (general) (routine) without abnormal findings: Secondary | ICD-10-CM

## 2016-01-05 MED ORDER — AMITRIPTYLINE HCL 10 MG PO TABS: 10.0000 mg | ORAL_TABLET | Freq: Every day | ORAL | 1 refills | Status: DC

## 2016-01-05 NOTE — Progress Notes (Signed)
Name: Tammy Evans   MRN: [id number]    DOB: [birth year]   Date:01/05/2016       Progress Note  Subjective  Chief Complaint  Chief Complaint  Patient presents with  . Annual Exam    HPI  Well Woman: she is sexually active, same partner for the past 4 years, no bladder problems at this time, recent STI panel negative.  Migraine headaches: she does not like taking daily medications for migraines, she did not take Topamax as prescribed, discussed importance of taking preventive medication to decrease number of days that she misses from work. She is missing two daily month because of headaches. She states pain is described as throbbing, on right frontal area behind right eye, Imitrex helps but usually has to sleep it off. She has associated nausea, occasional vomiting and also has photophobia. She needs FMLA forms filled out.    Patient Active Problem List   Diagnosis Date Noted  . Thrombocytopenia (HCC) 01/05/2016  . GAD (generalized anxiety disorder) 11/10/2015  . Migraine without aura and responsive to treatment 01/13/2015  . Insomnia, persistent 01/13/2015  . Night sweats 01/13/2015  . Shift work sleep disorder 01/13/2015    Past Surgical History:  Procedure Laterality Date  . CESAREAN SECTION    . culposcopy    . TUBAL LIGATION      Family History  Problem Relation Age of Onset  . Diabetes Father   . Heart disease Father   . Hypertension Father   . Drug abuse Sister   . Sickle cell trait Daughter     Social History   Social History  . Marital status: Single    Spouse name: N/A  . Number of children: N/A  . Years of education: N/A   Occupational History  . postal service employee    Social History Main Topics  . Smoking status: Never Smoker  . Smokeless tobacco: Never Used  . Alcohol use 0.0 oz/week     Comment: occasionally  . Drug use: No  . Sexual activity: Yes    Birth control/ protection: Condom     Comment: Tubal Ligation    Other Topics Concern   . 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  valACYclovir (VALTREX) 500 MG tablet, Take 1 tablet (500 mg total) by mouth daily. And TID for outbreaks, Disp: 35 tablet, Rfl: 0 .  amitriptyline (ELAVIL) 10 MG tablet, Take 1 tablet (10 mg total) by mouth at bedtime., Disp: 90 tablet, Rfl: 1 .  SUMAtriptan (IMITREX) 100 MG tablet, Take 1 tablet (100 mg total) by mouth once., Disp: 10 tablet, Rfl: 2  No Known Allergies   ROS  Constitutional: Negative for fever or weight change.  Respiratory: Negative for cough and shortness of breath.   Cardiovascular: Negative for chest pain or palpitations.  Gastrointestinal: Negative for abdominal pain, no bowel changes.  Musculoskeletal: Negative for gait problem or joint swelling.  Skin: Negative for rash.  Neurological: Negative for dizziness or headache.  No other specific complaints in a complete review of systems (except as listed in HPI above).  Objective  Vitals:   01/05/16 1417  BP: 104/68  Pulse: 80  Resp: 16  Temp: 98 F (36.7 C)  SpO2: 97%  Weight: 140 lb 8 oz (63.7 kg)  Height:  5\' 6"  (1.676 m)    Body mass index is 22.68 kg/m.  Physical Exam  Constitutional: Patient appears well-developed and well-nourished. No distress.  HENT: Head: Normocephalic and atraumatic. Ears: B TMs ok, no erythema or effusion; Nose: Nose normal. Mouth/Throat: Oropharynx is clear and moist. No oropharyngeal exudate.  Eyes: Conjunctivae and EOM are normal. Pupils are equal, round, and reactive to light. No scleral icterus.  Neck: Normal range of motion. Neck supple. No JVD present. No thyromegaly present.  Cardiovascular: Normal rate, regular rhythm and normal heart sounds.  No murmur heard. No BLE edema. Pulmonary/Chest: Effort normal and breath sounds normal.  No respiratory distress. Abdominal: Soft. Bowel sounds are normal, no distension. There is no tenderness. no masses Breast: no lumps or masses, no nipple discharge or rashes FEMALE GENITALIA:  External genitalia normal External urethra normal On her cycle at this time RECTAL: not done Musculoskeletal: Normal range of motion, no joint effusions. No gross deformities Neurological: he is alert and oriented to person, place, and time. No cranial nerve deficit. Coordination, balance, strength, speech and gait are normal.  Skin: Skin is warm and dry. No rash noted. No erythema.  Psychiatric: Patient has a normal mood and affect. behavior is normal. Judgment and thought content normal.   Recent Results (from the past 2160 hour(s))  POCT urinalysis dipstick     Status: Normal   Collection Time: 11/10/15  3:44 PM  Result Value Ref Range   Color, UA yellow    Clarity, UA clear    Glucose, UA neg    Bilirubin, UA neg    Ketones, UA neg    Spec Grav, UA 1.015    Blood, UA neg    pH, UA 6.0    Protein, UA neg    Urobilinogen, UA 0.2    Nitrite, UA neg    Leukocytes, UA Negative Negative  POCT HgB A1C     Status: Normal   Collection Time: 11/10/15  4:39 PM  Result Value Ref Range   Hemoglobin A1C 5.4   RPR     Status: None   Collection Time: 11/10/15  4:43 PM  Result Value Ref Range   RPR Ser Ql Non Reactive Non Reactive  HIV antibody (with reflex)     Status: None   Collection Time: 11/10/15  4:43 PM  Result Value Ref Range   HIV Screen 4th Generation wRfx Non Reactive Non Reactive  CULTURE, URINE COMPREHENSIVE     Status: None   Collection Time: 11/11/15 12:00 AM  Result Value Ref Range   Urine Culture, Comprehensive Final report    Result 1 Lactobacillus species     Comment: 25,000-50,000 colony forming units per mL Susceptibility not normally performed on this organism.   Chlamydia/Gonococcus/Trichomonas, NAA     Status: None   Collection Time: 11/11/15 12:00 AM  Result  Value Ref Range   Chlamydia by NAA Negative Negative   Gonococcus by NAA Negative Negative   Trich vag by NAA Negative Negative  Please Note     Status: None   Collection Time: 11/11/15 12:00 AM  Result Value Ref Range   Please note Comment     Comment: The date and/or time of collection was not indicated on the requisition as required by state and federal law.  The date of receipt of the specimen was used as the collection date if not supplied.   Please Note     Status: None   Collection Time: 11/11/15 12:00 AM  Result  Value Ref Range   Please note Comment     Comment: The date and/or time of collection was not indicated on the requisition as required by state and federal law.  The date of receipt of the specimen was used as the collection date if not supplied.      PHQ2/9: Depression screen [facility] 2/9 01/05/2016 11/10/2015 01/13/2015 11/28/2014  Decreased Interest 0 2 0 0  Down, Depressed, Hopeless 0 1 0 0  PHQ - 2 Score 0 3 0 0  Altered sleeping - 3 - -  Tired, decreased energy - 1 - -  Change in appetite - 0 - -  Feeling bad or failure about yourself  - 0 - -  Trouble concentrating - 2 - -  Moving slowly or fidgety/restless - 2 - -  Suicidal thoughts - 0 - -  PHQ-9 Score - 11 - -  Difficult doing work/chores - Somewhat difficult - -     Fall Risk: Fall Risk  01/05/2016 11/10/2015 01/13/2015  Falls in the past year? No No No     Functional Status Survey: Is the patient deaf or have difficulty hearing?: No Does the patient have difficulty seeing, even when wearing glasses/contacts?: No Does the patient have difficulty concentrating, remembering, or making decisions?: No Does the patient have difficulty walking or climbing stairs?: No Does the patient have difficulty dressing or bathing?: No Does the patient have difficulty doing errands alone such as visiting a doctor's office or shopping?: No    Assessment & Plan  1. Well woman exam  Discussed importance of 150  minutes of physical activity weekly, eat two servings of fish weekly, eat one serving of tree nuts ( cashews, pistachios, pecans, almonds.Marland Kitchen) every other day, eat 6 servings of fruit/vegetables daily and drink plenty of water and avoid sweet beverages.   2. Cervical cancer screening  Due for repeat in 2019  3. Breast cancer screening  - MM Digital Screening; Future  4. Migraine without aura and responsive to treatment  - amitriptyline (ELAVIL) 10 MG tablet; Take 1 tablet (10 mg total) by mouth at bedtime.  Dispense: 90 tablet; Refill: 1

## 2016-07-04 ENCOUNTER — Ambulatory Visit: Payer: Federal, State, Local not specified - PPO | Admitting: Family Medicine

## 2016-07-05 ENCOUNTER — Encounter: Payer: Self-pay | Admitting: Family Medicine

## 2016-07-05 ENCOUNTER — Ambulatory Visit (INDEPENDENT_AMBULATORY_CARE_PROVIDER_SITE_OTHER): Payer: Federal, State, Local not specified - PPO | Admitting: Family Medicine

## 2016-07-05 VITALS — BP 104/64 | HR 59 | Temp 98.0°F | Resp 16 | Wt 145.1 lb

## 2016-07-05 DIAGNOSIS — Z79899 Other long term (current) drug therapy: Secondary | ICD-10-CM | POA: Diagnosis not present

## 2016-07-05 DIAGNOSIS — F325 Major depressive disorder, single episode, in full remission: Secondary | ICD-10-CM | POA: Diagnosis not present

## 2016-07-05 DIAGNOSIS — R1013 Epigastric pain: Secondary | ICD-10-CM | POA: Diagnosis not present

## 2016-07-05 DIAGNOSIS — Z13220 Encounter for screening for lipoid disorders: Secondary | ICD-10-CM

## 2016-07-05 DIAGNOSIS — D696 Thrombocytopenia, unspecified: Secondary | ICD-10-CM

## 2016-07-05 DIAGNOSIS — G43009 Migraine without aura, not intractable, without status migrainosus: Secondary | ICD-10-CM | POA: Diagnosis not present

## 2016-07-05 LAB — CBC WITH DIFFERENTIAL/PLATELET
BASOS ABS: 35 {cells}/uL (ref 0–200)
Basophils Relative: 1 %
EOS ABS: 70 {cells}/uL (ref 15–500)
Eosinophils Relative: 2 %
HEMATOCRIT: 38.3 % (ref 35.0–45.0)
Hemoglobin: 12.3 g/dL (ref 11.7–15.5)
Lymphocytes Relative: 30 %
Lymphs Abs: 1050 cells/uL (ref 850–3900)
MCH: 29.7 pg (ref 27.0–33.0)
MCHC: 32.1 g/dL (ref 32.0–36.0)
MCV: 92.5 fL (ref 80.0–100.0)
MONO ABS: 420 {cells}/uL (ref 200–950)
MONOS PCT: 12 %
MPV: 10.5 fL (ref 7.5–12.5)
NEUTROS ABS: 1925 {cells}/uL (ref 1500–7800)
Neutrophils Relative %: 55 %
PLATELETS: 152 10*3/uL (ref 140–400)
RBC: 4.14 MIL/uL (ref 3.80–5.10)
RDW: 13.9 % (ref 11.0–15.0)
WBC: 3.5 10*3/uL — ABNORMAL LOW (ref 3.8–10.8)

## 2016-07-05 MED ORDER — OMEPRAZOLE 20 MG PO CPDR: 20.0000 mg | DELAYED_RELEASE_CAPSULE | Freq: Every day | ORAL | 3 refills | Status: DC

## 2016-07-05 NOTE — Patient Instructions (Signed)
Food Choices for Gastroesophageal Reflux Disease, Adult When you have gastroesophageal reflux disease (GERD), the foods you eat and your eating habits are very important. Choosing the right foods can help ease the discomfort of GERD. Consider working with a diet and nutrition specialist (dietitian) to help you make healthy food choices. What general guidelines should I follow? Eating plan   Choose healthy foods low in fat, such as fruits, vegetables, whole grains, low-fat dairy products, and lean meat, fish, and poultry.  Eat frequent, small meals instead of three large meals each day. Eat your meals slowly, in a relaxed setting. Avoid bending over or lying down until 2-3 hours after eating.  Limit high-fat foods such as fatty meats or fried foods.  Limit your intake of oils, butter, and shortening to less than 8 teaspoons each day.  Avoid the following:  Foods that cause symptoms. These may be different for different people. Keep a food diary to keep track of foods that cause symptoms.  Alcohol.  Drinking large amounts of liquid with meals.  Eating meals during the 2-3 hours before bed.  Cook foods using methods other than frying. This may include baking, grilling, or broiling. Lifestyle    Maintain a healthy weight. Ask your health care provider what weight is healthy for you. If you need to lose weight, work with your health care provider to do so safely.  Exercise for at least 30 minutes on 5 or more days each week, or as told by your health care provider.  Avoid wearing clothes that fit tightly around your waist and chest.  Do not use any products that contain nicotine or tobacco, such as cigarettes and e-cigarettes. If you need help quitting, ask your health care provider.  Sleep with the head of your bed raised. Use a wedge under the mattress or blocks under the bed frame to raise the head of the bed. What foods are not recommended? The items listed may not be a complete  list. Talk with your dietitian about what dietary choices are best for you. Grains  Pastries or quick breads with added fat. French toast. Vegetables  Deep fried vegetables. French fries. Any vegetables prepared with added fat. Any vegetables that cause symptoms. For some people this may include tomatoes and tomato products, chili peppers, onions and garlic, and horseradish. Fruits  Any fruits prepared with added fat. Any fruits that cause symptoms. For some people this may include citrus fruits, such as oranges, grapefruit, pineapple, and lemons. Meats and other protein foods  High-fat meats, such as fatty beef or pork, hot dogs, ribs, ham, sausage, salami and bacon. Fried meat or protein, including fried fish and fried chicken. Nuts and nut butters. Dairy  Whole milk and chocolate milk. Sour cream. Cream. Ice cream. Cream cheese. Milk shakes. Beverages  Coffee and tea, with or without caffeine. Carbonated beverages. Sodas. Energy drinks. Fruit juice made with acidic fruits (such as orange or grapefruit). Tomato juice. Alcoholic drinks. Fats and oils  Butter. Margarine. Shortening. Ghee. Sweets and desserts  Chocolate and cocoa. Donuts. Seasoning and other foods  Pepper. Peppermint and spearmint. Any condiments, herbs, or seasonings that cause symptoms. For some people, this may include curry, hot sauce, or vinegar-based salad dressings. Summary  When you have gastroesophageal reflux disease (GERD), food and lifestyle choices are very important to help ease the discomfort of GERD.  Eat frequent, small meals instead of three large meals each day. Eat your meals slowly, in a relaxed setting. Avoid bending over   or lying down until 2-3 hours after eating.  Limit high-fat foods such as fatty meat or fried foods. This information is not intended to replace advice given to you by your health care provider. Make sure you discuss any questions you have with your health care provider. Document  Released: 03/28/2005 Document Revised: 03/29/2016 Document Reviewed: 03/29/2016 Elsevier Interactive Patient Education  2017 Elsevier Inc.   Gastritis, Adult Gastritis is inflammation of the stomach. There are two kinds of gastritis:  Acute gastritis. This kind develops suddenly.  Chronic gastritis. This kind lasts for a long time. Gastritis happens when the lining of the stomach becomes weak or gets damaged. Without treatment, gastritis can lead to stomach bleeding and ulcers. What are the causes? This condition may be caused by:  An infection.  Drinking too much alcohol.  Certain medicines.  Having too much acid in the stomach.  A disease of the intestines or stomach.  Stress. What are the signs or symptoms? Symptoms of this condition include:  Pain or a burning in the upper abdomen.  Nausea.  Vomiting.  An uncomfortable feeling of fullness after eating. In some cases, there are no symptoms. How is this diagnosed? This condition may be diagnosed with:  A description of your symptoms.  A physical exam.  Tests. These can include:  Blood tests.  Stool tests.  A test in which a thin, flexible instrument with a light and camera on the end is passed down the esophagus and into the stomach (upper endoscopy).  A test in which a sample of tissue is taken for testing (biopsy). How is this treated? This condition may be treated with medicines. If the condition is caused by a bacterial infection, you may be given antibiotic medicines. If it is caused by too much acid in the stomach, you may get medicines called H2 blockers, proton pump inhibitors, or antacids. Treatment may also involve stopping the use of certain medicines, such as aspirin, ibuprofen, or other nonsteroidal anti-inflammatory drugs (NSAIDs). Follow these instructions at home:  Take over-the-counter and prescription medicines only as told by your health care provider.  If you were prescribed an  antibiotic, take it as told by your health care provider. Do not stop taking the antibiotic even if you start to feel better.  Drink enough fluid to keep your urine clear or pale yellow.  Eat small, frequent meals instead of large meals. Contact a health care provider if:  Your symptoms get worse.  Your symptoms return after treatment. Get help right away if:  You vomit blood or material that looks like coffee grounds.  You have black or dark red stools.  You are unable to keep fluids down.  Your abdominal pain gets worse.  You have a fever.  You do not feel better after 1 week. This information is not intended to replace advice given to you by your health care provider. Make sure you discuss any questions you have with your health care provider. Document Released: 03/22/2001 Document Revised: 11/25/2015 Document Reviewed: 12/20/2014 Elsevier Interactive Patient Education  2017 Elsevier Inc.  

## 2016-07-05 NOTE — Progress Notes (Signed)
Name: Tammy Evans   MRN: [id number]    DOB: 21-Nov-1970   Date:07/05/2016       Progress Note  Subjective  Chief Complaint  Chief Complaint  Patient presents with  . Migraine    6 month follow up last one was Sun-Mon    HPI  Migraine headaches: she states it is initially on right frontal area, sharp like and improves when she takes [facility] powders and sleeps. She has nausea and photophobia and phonophobia. I gave her a prescription for Imitrex and Elavil, but stopped taking it because of stomach issues. Imitrex seems to help better than BC powders. She never started prophylactic medication. She is still missing a couple of times a month secondary to migraine episodes.   Epigastric pain: on and off for the past few years, but getting progressively worse and over the past 3 months has daily symptoms and much more severe than before. Worse after she eats, described as burning, sometimes sharp pain. Improves with Tums and Ranitidine. She states alcohol makes it much worse and she stopped drinking ( she states only drinks occasionally ) Appetite is normal, no nausea or vomiting, no weight loss, she has noticed that she takes Pepto Bismol and gets dark stools and at times feels constipated.   Major Depression: she states she is feeling better, no sadness, crying spells or problem sleeping.   Thrombocytopenia: she agrees in having repeat lab work   Patient Active Problem List   Diagnosis Date Noted  . Thrombocytopenia (HCC) 01/05/2016  . GAD (generalized anxiety disorder) 11/10/2015  . Migraine without aura and responsive to treatment 01/13/2015  . Insomnia, persistent 01/13/2015  . Night sweats 01/13/2015  . Shift work sleep disorder 01/13/2015    Past Surgical History:  Procedure Laterality Date  . CESAREAN SECTION    . culposcopy    . TUBAL LIGATION      Family History  Problem Relation Age of Onset  . Diabetes Father   . Heart disease Father   . Hypertension Father   . Drug  abuse Sister   . Sickle cell trait Daughter     Social History   Social History  . Marital status: Single    Spouse name: N/A  . Number of children: N/A  . Years of education: N/A   Occupational History  . postal service employee    Social History Main Topics  . Smoking status: Never Smoker  . Smokeless tobacco: Never Used  . Alcohol use 0.0 oz/week     Comment: occasionally  . Drug use: No  . Sexual activity: Yes    Birth control/ protection: Condom     Comment: Tubal Ligation    Other Topics Concern  . 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  amitriptyline (ELAVIL) 10 MG tablet, Take 1 tablet (10 mg total) by mouth at bedtime., Disp: 90 tablet, Rfl: 1 .  omeprazole (PRILOSEC) 20 MG capsule, Take 1 capsule (20 mg total) by mouth daily., Disp: 30 capsule, Rfl: 3 .  SUMAtriptan (IMITREX) 100 MG tablet, Take 1 tablet (100 mg total) by mouth once., Disp: 10 tablet, Rfl: 2 .  valACYclovir (VALTREX) 500 MG tablet, Take 1 tablet (500 mg total) by mouth daily. And TID for outbreaks, Disp: 35 tablet, Rfl: 0  No Known Allergies   ROS  Constitutional: Negative for fever or weight change.  Respiratory: Negative for cough and shortness of breath.   Cardiovascular: Negative for chest pain or palpitations.  Gastrointestinal: Positive for abdominal pain, positive  bowel changes - she notices constipation but has been taking peptobismol.  Musculoskeletal: Negative for gait problem or joint swelling.  Skin: Negative for rash.  Neurological: Negative for dizziness or headache.  No other specific complaints in a complete review of systems (except as listed in HPI above).  Objective  Vitals:   07/05/16 1320  BP: 104/64  Pulse: (!) 59  Resp: 16  Temp: 98 F (36.7 C)  SpO2: 99%  Weight: 145  lb 1 oz (65.8 kg)    Body mass index is 23.41 kg/m.  Physical Exam  Constitutional: Patient appears well-developed and well-nourished.  No distress.  HEENT: head atraumatic, normocephalic, pupils equal and reactive to light,  neck supple, throat within normal limits Cardiovascular: Normal rate, regular rhythm and normal heart sounds.  No murmur heard. No BLE edema. Pulmonary/Chest: Effort normal and breath sounds normal. No respiratory distress. Abdominal: Soft.  There is mild diffuse  tenderness.  Psychiatric: Patient has a normal mood and affect. behavior is normal. Judgment and thought content normal.  PHQ2/9: [facility] screen [facility] 2/9 01/05/2016 11/10/2015 01/13/2015 11/28/2014  Decreased Interest 0 2 0 0  Down, Depressed, Hopeless 0 1 0 0  PHQ - 2 Score 0 3 0 0  Altered sleeping - 3 - -  Tired, decreased energy - 1 - -  Change in appetite - 0 - -  Feeling bad or failure about yourself  - 0 - -  Trouble concentrating - 2 - -  Moving slowly or fidgety/restless - 2 - -  Suicidal thoughts - 0 - -  PHQ-9 Score - 11 - -  Difficult doing work/chores - Somewhat difficult - -     Fall Risk: Fall Risk  01/05/2016 11/10/2015 01/13/2015  Falls in the past year? No No No      Assessment & Plan  1. Migraine without aura and responsive to treatment  She stopped taking Elavil because of dyspepsia. She has missed at most 2 days per month secondary of migraine and needs FMLA forms filled out  2. Major depression in remission [facility](HCC)  Doing well at this time  3. Thrombocytopenia (HCC)  - CBC with Differential/Platelet  4. Long-term use of high-risk medication  - COMPLETE METABOLIC PANEL WITH GFR  5. Lipid screening  - Lipid panel  6. Epigastric pain  Discussed possible gastritis versus GERD. We will check h. Pylori , change diet and if needed refer to GI - H. pylori breath test - omeprazole (PRILOSEC) 20 MG capsule; Take 1 capsule (20 mg total) by mouth daily.  Dispense: 30  capsule; Refill: 3  7. Dyspepsia  - H. pylori breath test - omeprazole (PRILOSEC) 20 MG capsule; Take 1 capsule (20 mg total) by mouth daily.  Dispense: 30 capsule; Refill: 3

## 2016-07-06 LAB — LIPID PANEL
CHOLESTEROL: 170 mg/dL (ref <200)
HDL: 56 mg/dL (ref >50)
LDL Cholesterol: 103 mg/dL — ABNORMAL HIGH (ref <100)
TRIGLYCERIDES: 57 mg/dL (ref <150)
Total CHOL/HDL Ratio: 3 Ratio (ref <5.0)
VLDL: 11 mg/dL (ref <30)

## 2016-07-06 LAB — COMPLETE METABOLIC PANEL WITH GFR
ALBUMIN: 4 g/dL (ref 3.6–5.1)
ALK PHOS: 39 U/L (ref 33–115)
ALT: 10 U/L (ref 6–29)
AST: 15 U/L (ref 10–35)
BILIRUBIN TOTAL: 0.8 mg/dL (ref 0.2–1.2)
BUN: 15 mg/dL (ref 7–25)
CALCIUM: 8.8 mg/dL (ref 8.6–10.2)
CO2: 25 mmol/L (ref 20–31)
CREATININE: 0.98 mg/dL (ref 0.50–1.10)
Chloride: 105 mmol/L (ref 98–110)
GFR, EST AFRICAN AMERICAN: 79 mL/min (ref >=60)
GFR, EST NON AFRICAN AMERICAN: 68 mL/min (ref >=60)
Glucose, Bld: 73 mg/dL (ref 65–99)
Potassium: 4.1 mmol/L (ref 3.5–5.3)
Sodium: 137 mmol/L (ref 135–146)
TOTAL PROTEIN: 6.4 g/dL (ref 6.1–8.1)

## 2016-07-06 LAB — H. PYLORI BREATH TEST: H. pylori Breath Test: NOT DETECTED

## 2016-07-07 ENCOUNTER — Telehealth: Payer: Self-pay

## 2016-07-07 NOTE — Telephone Encounter (Signed)
Patient states someone called her, the only recent activity I see in her chart was some labs done on 07/05/16. Informed patient of all her labs results and patient verbalized understanding of results.

## 2016-08-08 ENCOUNTER — Ambulatory Visit (INDEPENDENT_AMBULATORY_CARE_PROVIDER_SITE_OTHER): Payer: Federal, State, Local not specified - PPO | Admitting: Family Medicine

## 2016-08-08 ENCOUNTER — Encounter: Payer: Self-pay | Admitting: Family Medicine

## 2016-08-08 VITALS — BP 108/64 | HR 88 | Temp 97.5°F | Resp 16 | Ht 66.0 in | Wt 142.4 lb

## 2016-08-08 DIAGNOSIS — K2950 Unspecified chronic gastritis without bleeding: Secondary | ICD-10-CM

## 2016-08-08 DIAGNOSIS — A6000 Herpesviral infection of urogenital system, unspecified: Secondary | ICD-10-CM | POA: Diagnosis not present

## 2016-08-08 DIAGNOSIS — D708 Other neutropenia: Secondary | ICD-10-CM

## 2016-08-08 MED ORDER — VALACYCLOVIR HCL 500 MG PO TABS: 500.0000 mg | ORAL_TABLET | Freq: Every day | ORAL | 0 refills | Status: DC

## 2016-08-08 MED ORDER — RANITIDINE HCL 150 MG PO TABS
150.0000 mg | ORAL_TABLET | Freq: Two times a day (BID) | ORAL | 1 refills | Status: DC
Start: 2016-08-08 — End: 2017-12-27

## 2016-08-08 NOTE — Progress Notes (Signed)
Name: Tammy Evans   MRN: [id number]    DOB: [birth year]   Date:08/08/2016       Progress Note  Subjective  Chief Complaint  Chief Complaint  Patient presents with  . Follow-up    1 month F/U and needs refill on Valtrex  . Gastroesophageal Reflux    Well controlled with new medications    HPI  Gastritis: she was seen last month with worsening of symptoms of epigastric pain for the previous months. She states within one week of taking Omeprazole symptoms resolved, she is not following a GERD appropriate diet. She denies any nausea or vomiting. No change in bowel movements. Discussed long term risk of PPI use, and she will take it for another month and after that start to wean off with Ranitidine.   Herpes outbreak: she has outbreaks about once a year and needs refill of medication, same sexual partner and he is aware that she has it. Discussed daily medication, but she wants to hold off. They use condoms  Neutropenia: mild, previously have low platelets but that resolved   Patient Active Problem List   Diagnosis Date Noted  . Leukopenia 08/08/2016  . GAD (generalized anxiety disorder) 11/10/2015  . Migraine without aura and responsive to treatment 01/13/2015  . Insomnia, persistent 01/13/2015  . Night sweats 01/13/2015  . Shift work sleep disorder 01/13/2015    Past Surgical History:  Procedure Laterality Date  . CESAREAN SECTION    . culposcopy    . TUBAL LIGATION      Family History  Problem Relation Age of Onset  . Diabetes Father   . Heart disease Father   . Hypertension Father   . Drug abuse Sister   . Sickle cell trait Daughter     Social History   Social History  . Marital status: Single    Spouse name: N/A  . Number of children: N/A  . Years of education: N/A   Occupational History  . postal service employee    Social History Main Topics  . Smoking status: Never Smoker  . Smokeless tobacco: Never Used  . Alcohol use 0.0 oz/week     Comment:  occasionally  . Drug use: No  . Sexual activity: Yes    Birth control/ protection: Condom     Comment: Tubal Ligation    Other Topics Concern  . 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   She lives with boyfriend, she has grown children from previous relationship     Current Outpatient Prescriptions:  .  amitriptyline (ELAVIL) 10 MG tablet, Take 1 tablet (10 mg total) by mouth at bedtime., Disp: 90 tablet, Rfl: 1 .  omeprazole (PRILOSEC) 20 MG capsule, Take 1 capsule (20 mg total) by mouth daily., Disp: 30 capsule, Rfl: 3 .  valACYclovir (VALTREX) 500 MG tablet, Take 1 tablet (500 mg total) by mouth daily. And TID for outbreaks, Disp: 35 tablet, Rfl: 0 .  SUMAtriptan (IMITREX) 100 MG tablet, Take 1 tablet (100 mg total) by mouth once., Disp: 10 tablet, Rfl: 2  No Known Allergies   ROS  Constitutional: Negative for fever or weight change.  Respiratory: Negative for cough and shortness of breath.   Cardiovascular: Negative for chest pain or palpitations.  Gastrointestinal: Negative for abdominal pain, no bowel changes.  Musculoskeletal: Negative for gait problem or joint swelling.  Skin: Negative for rash.  Neurological: Negative for dizziness or headache.  No other  specific complaints in a complete review of systems (except as listed in HPI above).   Objective  Vitals:   08/08/16 1157  BP: 108/64  Pulse: 88  Resp: 16  Temp: 97.5 F (36.4 C)  TempSrc: Oral  SpO2: 96%  Weight: 142 lb 6.4 oz (64.6 kg)  Height: 5' 6"  (1.676 m)    Body mass index is 22.98 kg/m.  Physical Exam  Constitutional: Patient appears well-developed and well-nourished. No distress.  HEENT: head atraumatic, normocephalic, pupils equal and reactive to light, neck supple, throat within normal limits Cardiovascular: Normal rate, regular rhythm and normal heart sounds.  No murmur heard. No BLE edema. Pulmonary/Chest: Effort normal and breath sounds  normal. No respiratory distress. Abdominal: Soft.  There is no tenderness. Psychiatric: Patient has a normal mood and affect. behavior is normal. Judgment and thought content normal.  Recent Results (from the past 2160 hour(s))  CBC with Differential/Platelet     Status: Abnormal   Collection Time: 07/05/16  1:57 PM  Result Value Ref Range   WBC 3.5 (L) 3.8 - 10.8 K/uL   RBC 4.14 3.80 - 5.10 MIL/uL   Hemoglobin 12.3 11.7 - 15.5 g/dL   HCT 38.3 35.0 - 45.0 %   MCV 92.5 80.0 - 100.0 fL   MCH 29.7 27.0 - 33.0 pg   MCHC 32.1 32.0 - 36.0 g/dL   RDW 13.9 11.0 - 15.0 %   Platelets 152 140 - 400 K/uL   MPV 10.5 7.5 - 12.5 fL   Neutro Abs 1,925 1,500 - 7,800 cells/uL   Lymphs Abs 1,050 850 - 3,900 cells/uL   Monocytes Absolute 420 200 - 950 cells/uL   Eosinophils Absolute 70 15 - 500 cells/uL   Basophils Absolute 35 0 - 200 cells/uL   Neutrophils Relative % 55 %   Lymphocytes Relative 30 %   Monocytes Relative 12 %   Eosinophils Relative 2 %   Basophils Relative 1 %   Smear Review Criteria for review not met   COMPLETE METABOLIC PANEL WITH GFR     Status: None   Collection Time: 07/05/16  1:57 PM  Result Value Ref Range   Sodium 137 135 - 146 mmol/L   Potassium 4.1 3.5 - 5.3 mmol/L   Chloride 105 98 - 110 mmol/L   CO2 25 20 - 31 mmol/L   Glucose, Bld 73 65 - 99 mg/dL   BUN 15 7 - 25 mg/dL   Creat 0.98 0.50 - 1.10 mg/dL   Total Bilirubin 0.8 0.2 - 1.2 mg/dL   Alkaline Phosphatase 39 33 - 115 U/L   AST 15 10 - 35 U/L   ALT 10 6 - 29 U/L   Total Protein 6.4 6.1 - 8.1 g/dL   Albumin 4.0 3.6 - 5.1 g/dL   Calcium 8.8 8.6 - 10.2 mg/dL   GFR, Est African American 79 >=60 mL/min   GFR, Est Non African American 68 >=60 mL/min  Lipid panel     Status: Abnormal   Collection Time: 07/05/16  1:57 PM  Result Value Ref Range   Cholesterol 170 <200 mg/dL   Triglycerides 57 <150 mg/dL   HDL 56 >50 mg/dL   Total CHOL/HDL Ratio 3.0 <5.0 Ratio   VLDL 11 <30 mg/dL   LDL Cholesterol 103 (H)  <100 mg/dL  H. pylori breath test     Status: None   Collection Time: 07/05/16  1:57 PM  Result Value Ref Range   H. pylori Breath Test NOT DETECTED Not  Detected    Comment:   Antimicrobials, proton pump inhibitors, and bismuth preparations are known to suppress H. pylori, and ingestion of these prior to H. pylori diagnostic testing may lead to false negative results. If clinically indicated, the test may be repeated on a new specimen obtained two weeks after discontinuing treatment.        PHQ2/9: Depression screen [facility] 2/9 08/08/2016 01/05/2016 11/10/2015 01/13/2015 11/28/2014  Decreased Interest 0 0 2 0 0  Down, Depressed, Hopeless 0 0 1 0 0  PHQ - 2 Score 0 0 3 0 0  Altered sleeping - - 3 - -  Tired, decreased energy - - 1 - -  Change in appetite - - 0 - -  Feeling bad or failure about yourself  - - 0 - -  Trouble concentrating - - 2 - -  Moving slowly or fidgety/restless - - 2 - -  Suicidal thoughts - - 0 - -  PHQ-9 Score - - 11 - -  Difficult doing work/chores - - Somewhat difficult - -     Fall Risk: Fall Risk  08/08/2016 01/05/2016 11/10/2015 01/13/2015  Falls in the past year? No No No No     Functional Status Survey: Is the patient deaf or have difficulty hearing?: No Does the patient have difficulty seeing, even when wearing glasses/contacts?: No Does the patient have difficulty concentrating, remembering, or making decisions?: No Does the patient have difficulty walking or climbing stairs?: No Does the patient have difficulty dressing or bathing?: No Does the patient have difficulty doing errands alone such as visiting a doctor's office or shopping?: No    Assessment & Plan  1. Other chronic gastritis without hemorrhage  Explained the importance of following a bland diet, and try to wean self off PPI within the next couple of months.  - ranitidine (ZANTAC) 150 MG tablet; Take 1 tablet (150 mg total) by mouth 2 (two) times daily.  Dispense: 180 tablet; Refill:  1   2. Other neutropenia ([place])  Recheck next visit    3. Genital herpes simplex, unspecified site  - valACYclovir (VALTREX) 500 MG tablet; Take 1 tablet (500 mg total) by mouth daily. And TID for outbreaks  Dispense: 35 tablet; Refill: 0

## 2016-08-08 NOTE — Patient Instructions (Signed)
Food Choices for Gastroesophageal Reflux Disease, Adult When you have gastroesophageal reflux disease (GERD), the foods you eat and your eating habits are very important. Choosing the right foods can help ease the discomfort of GERD. Consider working with a diet and nutrition specialist (dietitian) to help you make healthy food choices. What general guidelines should I follow? Eating plan   Choose healthy foods low in fat, such as fruits, vegetables, whole grains, low-fat dairy products, and lean meat, fish, and poultry.  Eat frequent, small meals instead of three large meals each day. Eat your meals slowly, in a relaxed setting. Avoid bending over or lying down until 2-3 hours after eating.  Limit high-fat foods such as fatty meats or fried foods.  Limit your intake of oils, butter, and shortening to less than 8 teaspoons each day.  Avoid the following:  Foods that cause symptoms. These may be different for different people. Keep a food diary to keep track of foods that cause symptoms.  Alcohol.  Drinking large amounts of liquid with meals.  Eating meals during the 2-3 hours before bed.  Cook foods using methods other than frying. This may include baking, grilling, or broiling. Lifestyle    Maintain a healthy weight. Ask your health care provider what weight is healthy for you. If you need to lose weight, work with your health care provider to do so safely.  Exercise for at least 30 minutes on 5 or more days each week, or as told by your health care provider.  Avoid wearing clothes that fit tightly around your waist and chest.  Do not use any products that contain nicotine or tobacco, such as cigarettes and e-cigarettes. If you need help quitting, ask your health care provider.  Sleep with the head of your bed raised. Use a wedge under the mattress or blocks under the bed frame to raise the head of the bed. What foods are not recommended? The items listed may not be a complete  list. Talk with your dietitian about what dietary choices are best for you. Grains  Pastries or quick breads with added fat. French toast. Vegetables  Deep fried vegetables. French fries. Any vegetables prepared with added fat. Any vegetables that cause symptoms. For some people this may include tomatoes and tomato products, chili peppers, onions and garlic, and horseradish. Fruits  Any fruits prepared with added fat. Any fruits that cause symptoms. For some people this may include citrus fruits, such as oranges, grapefruit, pineapple, and lemons. Meats and other protein foods  High-fat meats, such as fatty beef or pork, hot dogs, ribs, ham, sausage, salami and bacon. Fried meat or protein, including fried fish and fried chicken. Nuts and nut butters. Dairy  Whole milk and chocolate milk. Sour cream. Cream. Ice cream. Cream cheese. Milk shakes. Beverages  Coffee and tea, with or without caffeine. Carbonated beverages. Sodas. Energy drinks. Fruit juice made with acidic fruits (such as orange or grapefruit). Tomato juice. Alcoholic drinks. Fats and oils  Butter. Margarine. Shortening. Ghee. Sweets and desserts  Chocolate and cocoa. Donuts. Seasoning and other foods  Pepper. Peppermint and spearmint. Any condiments, herbs, or seasonings that cause symptoms. For some people, this may include curry, hot sauce, or vinegar-based salad dressings. Summary  When you have gastroesophageal reflux disease (GERD), food and lifestyle choices are very important to help ease the discomfort of GERD.  Eat frequent, small meals instead of three large meals each day. Eat your meals slowly, in a relaxed setting. Avoid bending over   or lying down until 2-3 hours after eating.  Limit high-fat foods such as fatty meat or fried foods. This information is not intended to replace advice given to you by your health care provider. Make sure you discuss any questions you have with your health care provider. Document  Released: 03/28/2005 Document Revised: 03/29/2016 Document Reviewed: 03/29/2016 Elsevier Interactive Patient Education  2017 Elsevier Inc.   Gastritis, Adult Gastritis is inflammation of the stomach. There are two kinds of gastritis:  Acute gastritis. This kind develops suddenly.  Chronic gastritis. This kind lasts for a long time. Gastritis happens when the lining of the stomach becomes weak or gets damaged. Without treatment, gastritis can lead to stomach bleeding and ulcers. What are the causes? This condition may be caused by:  An infection.  Drinking too much alcohol.  Certain medicines.  Having too much acid in the stomach.  A disease of the intestines or stomach.  Stress. What are the signs or symptoms? Symptoms of this condition include:  Pain or a burning in the upper abdomen.  Nausea.  Vomiting.  An uncomfortable feeling of fullness after eating. In some cases, there are no symptoms. How is this diagnosed? This condition may be diagnosed with:  A description of your symptoms.  A physical exam.  Tests. These can include:  Blood tests.  Stool tests.  A test in which a thin, flexible instrument with a light and camera on the end is passed down the esophagus and into the stomach (upper endoscopy).  A test in which a sample of tissue is taken for testing (biopsy). How is this treated? This condition may be treated with medicines. If the condition is caused by a bacterial infection, you may be given antibiotic medicines. If it is caused by too much acid in the stomach, you may get medicines called H2 blockers, proton pump inhibitors, or antacids. Treatment may also involve stopping the use of certain medicines, such as aspirin, ibuprofen, or other nonsteroidal anti-inflammatory drugs (NSAIDs). Follow these instructions at home:  Take over-the-counter and prescription medicines only as told by your health care provider.  If you were prescribed an  antibiotic, take it as told by your health care provider. Do not stop taking the antibiotic even if you start to feel better.  Drink enough fluid to keep your urine clear or pale yellow.  Eat small, frequent meals instead of large meals. Contact a health care provider if:  Your symptoms get worse.  Your symptoms return after treatment. Get help right away if:  You vomit blood or material that looks like coffee grounds.  You have black or dark red stools.  You are unable to keep fluids down.  Your abdominal pain gets worse.  You have a fever.  You do not feel better after 1 week. This information is not intended to replace advice given to you by your health care provider. Make sure you discuss any questions you have with your health care provider. Document Released: 03/22/2001 Document Revised: 11/25/2015 Document Reviewed: 12/20/2014 Elsevier Interactive Patient Education  2017 Elsevier Inc.  

## 2016-09-12 ENCOUNTER — Encounter: Payer: Self-pay | Admitting: Family Medicine

## 2016-09-12 ENCOUNTER — Other Ambulatory Visit: Payer: Self-pay | Admitting: Family Medicine

## 2016-09-12 DIAGNOSIS — A6000 Herpesviral infection of urogenital system, unspecified: Secondary | ICD-10-CM

## 2016-09-12 NOTE — Telephone Encounter (Signed)
Patient requesting refill of Valtrex to [facility]. 

## 2016-09-24 DIAGNOSIS — J069 Acute upper respiratory infection, unspecified: Secondary | ICD-10-CM | POA: Diagnosis not present

## 2016-09-24 DIAGNOSIS — J9801 Acute bronchospasm: Secondary | ICD-10-CM | POA: Diagnosis not present

## 2016-11-07 ENCOUNTER — Encounter: Payer: Self-pay | Admitting: Family Medicine

## 2016-11-07 ENCOUNTER — Ambulatory Visit (INDEPENDENT_AMBULATORY_CARE_PROVIDER_SITE_OTHER): Payer: Federal, State, Local not specified - PPO | Admitting: Family Medicine

## 2016-11-07 VITALS — BP 122/78 | HR 81 | Temp 97.6°F | Resp 18 | Ht 66.0 in | Wt 146.0 lb

## 2016-11-07 DIAGNOSIS — A6000 Herpesviral infection of urogenital system, unspecified: Secondary | ICD-10-CM

## 2016-11-07 DIAGNOSIS — G4709 Other insomnia: Secondary | ICD-10-CM | POA: Diagnosis not present

## 2016-11-07 DIAGNOSIS — K2950 Unspecified chronic gastritis without bleeding: Secondary | ICD-10-CM

## 2016-11-07 DIAGNOSIS — G43009 Migraine without aura, not intractable, without status migrainosus: Secondary | ICD-10-CM | POA: Diagnosis not present

## 2016-11-07 DIAGNOSIS — D708 Other neutropenia: Secondary | ICD-10-CM

## 2016-11-07 LAB — CBC
HEMATOCRIT: 37.5 % (ref 35.0–45.0)
Hemoglobin: 12.4 g/dL (ref 11.7–15.5)
MCH: 30.5 pg (ref 27.0–33.0)
MCHC: 33.1 g/dL (ref 32.0–36.0)
MCV: 92.4 fL (ref 80.0–100.0)
MPV: 10.6 fL (ref 7.5–12.5)
PLATELETS: 138 10*3/uL — AB (ref 140–400)
RBC: 4.06 MIL/uL (ref 3.80–5.10)
RDW: 14.1 % (ref 11.0–15.0)
WBC: 5.9 10*3/uL (ref 3.8–10.8)

## 2016-11-07 MED ORDER — TRAZODONE HCL 50 MG PO TABS
25.0000 mg | ORAL_TABLET | Freq: Every evening | ORAL | 0 refills | Status: DC | PRN
Start: 2016-11-07 — End: 2017-01-08

## 2016-11-07 MED ORDER — VALACYCLOVIR HCL 500 MG PO TABS: ORAL_TABLET | ORAL | 1 refills | Status: DC

## 2016-11-07 MED ORDER — SUMATRIPTAN SUCCINATE 100 MG PO TABS: 100.0000 mg | ORAL_TABLET | Freq: Once | ORAL | 2 refills | Status: DC

## 2016-11-07 NOTE — Progress Notes (Signed)
Name: Tammy Evans   MRN: [id number]    DOB: 03-17-1971   Date:11/07/2016       Progress Note  Subjective  Chief Complaint  Chief Complaint  Patient presents with  . Migraine    3 month follow up no issues  . Depression  . Gastroesophageal Reflux    HPI   Gastritis: she was seen March 2018 with worsening of symptoms of epigastric pain for the previous months. She states within one week of taking Omeprazole symptoms resolved, she is not following a GERD appropriate diet, she has not been drinking much - 3 glasses of alcohol in the past month. She denies any nausea or vomiting. No change in bowel movements.   Herpes outbreak: she has outbreaks about once a year and needs refill of medication, same sexual partner and he is aware that she has it. Discussed daily medication, but she wants to hold off. They use condoms, she would like a refill to use prn ( [place]   Neutropenia: mild, previously have low platelets but that resolved, we will recheck labs  Insomnia: she states she shift from 3rd to 1 st shift three weeks ago. She states she could sleep better when she worked 3rd shift for 11 years. She is napping when she gets home from work. Not exercising. She is taking Melatonin but does not keep her asleep and takes her 3 hours to fall asleep. She states mind is busy when she goes to bed. Tossing and turning.   Migraine : only one episode since last visit, 3 weeks ago, she stopped taking preventive medication, taking Imitrex prn. Headache is usually right frontal, throbbing, pulsating, and "it  feels like my  eye ball will come out of my face". Associated with photophobia, phonophobia and nausea. Imitrex works well for her, it takes about 45- 1 hour for the symptoms to resolve once she takes medication  Patient Active Problem List   Diagnosis Date Noted  . Leukopenia 08/08/2016  . Genital herpes 08/08/2016  . GAD (generalized anxiety disorder) 11/10/2015  . Migraine  without aura and responsive to treatment 01/13/2015  . Insomnia, persistent 01/13/2015  . Night sweats 01/13/2015  . Shift work sleep disorder 01/13/2015    Past Surgical History:  Procedure Laterality Date  . CESAREAN SECTION    . culposcopy    . TUBAL LIGATION      Family History  Problem Relation Age of Onset  . Diabetes Father   . Heart disease Father   . Hypertension Father   . Drug abuse Sister   . Sickle cell trait Daughter     Social History   Social History  . Marital status: Single    Spouse name: N/A  . Number of children: N/A  . Years of education: N/A   Occupational History  . postal service employee    Social History Main Topics  . Smoking status: Never Smoker  . Smokeless tobacco: Never Used  . Alcohol use 0.0 oz/week     Comment: occasionally  . Drug use: No  . Sexual activity: Yes    Birth control/ protection: Condom     Comment: Tubal Ligation    Other Topics Concern  . 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   She lives with boyfriend, she has grown children from previous relationship     Current Outpatient Prescriptions:  .  omeprazole (PRILOSEC) 20 MG capsule, Take  1 capsule (20 mg total) by mouth daily., Disp: 30 capsule, Rfl: 3 .  ranitidine (ZANTAC) 150 MG tablet, Take 1 tablet (150 mg total) by mouth 2 (two) times daily., Disp: 180 tablet, Rfl: 1 .  valACYclovir (VALTREX) 500 MG tablet, TAKE 1 TABLET (500 MG TOTAL) BY MOUTH DAILY. TAKE 3 TIMES A DAY FOR OUTBREAKS, Disp: 35 tablet, Rfl: 0 .  SUMAtriptan (IMITREX) 100 MG tablet, Take 1 tablet (100 mg total) by mouth once., Disp: 10 tablet, Rfl: 2 .  traZODone (DESYREL) 50 MG tablet, Take 0.5-2 tablets (25-100 mg total) by mouth at bedtime as needed for sleep., Disp: 60 tablet, Rfl: 0  No Known Allergies   ROS  Constitutional: Negative for fever or weight change.  Respiratory: Negative for cough and shortness of breath.   Cardiovascular:  Negative for chest pain or palpitations.  Gastrointestinal: Negative for abdominal pain, no bowel changes.  Musculoskeletal: Negative for gait problem or joint swelling.  Skin: Negative for rash.  Neurological: Negative for dizziness or headache.  No other specific complaints in a complete review of systems (except as listed in HPI above).  Objective  Vitals:   11/07/16 1327  BP: 122/78  Pulse: 81  Resp: 18  Temp: 97.6 F (36.4 C)  SpO2: 95%  Weight: 146 lb (66.2 kg)  Height: 5\' 6"  (1.676 m)    Body mass index is 23.57 kg/m.  Physical Exam  Constitutional: Patient appears well-developed and well-nourished.  No distress.  HEENT: head atraumatic, normocephalic, pupils equal and reactive to light,  neck supple, throat within normal limits Cardiovascular: Normal rate, regular rhythm and normal heart sounds.  No murmur heard. No BLE edema. Pulmonary/Chest: Effort normal and breath sounds normal. No respiratory distress. Abdominal: Soft.  There is no tenderness. Psychiatric: Patient has a normal mood and affect. behavior is normal. Judgment and thought content normal.  PHQ2/9: Depression screen [facility] 2/9 08/08/2016 01/05/2016 11/10/2015 01/13/2015 11/28/2014  Decreased Interest 0 0 2 0 0  Down, Depressed, Hopeless 0 0 1 0 0  PHQ - 2 Score 0 0 3 0 0  Altered sleeping - - 3 - -  Tired, decreased energy - - 1 - -  Change in appetite - - 0 - -  Feeling bad or failure about yourself  - - 0 - -  Trouble concentrating - - 2 - -  Moving slowly or fidgety/restless - - 2 - -  Suicidal thoughts - - 0 - -  PHQ-9 Score - - 11 - -  Difficult doing work/chores - - Somewhat difficult - -     Fall Risk: Fall Risk  08/08/2016 01/05/2016 11/10/2015 01/13/2015  Falls in the past year? No No No No     Assessment & Plan  1. Other chronic gastritis without hemorrhage  She is off medication, advised to take medications while on vacation   2. Other neutropenia (HCC)  -CBC  3. Migraine without  aura and responsive to treatment  - SUMAtriptan (IMITREX) 100 MG tablet; Take 1 tablet (100 mg total) by mouth once.  Dispense: 10 tablet; Refill: 2  4. Other  insomnia  - traZODone (DESYREL) 50 MG tablet; Take 0.5-2 tablets (25-100 mg total) by mouth at bedtime as needed for sleep.  Dispense: 60 tablet; Refill: 0  5. Genital herpes simplex, unspecified site  - valACYclovir (VALTREX) 500 MG tablet; TAKE 1 TABLET (500 MG TOTAL) BY MOUTH DAILY. TAKE 3 TIMES A DAY FOR OUTBREAKS  Dispense: 35 tablet; Refill: 1

## 2017-01-07 ENCOUNTER — Other Ambulatory Visit: Payer: Self-pay | Admitting: Family Medicine

## 2017-01-07 DIAGNOSIS — G4709 Other insomnia: Secondary | ICD-10-CM

## 2017-01-08 ENCOUNTER — Other Ambulatory Visit: Payer: Self-pay | Admitting: Family Medicine

## 2017-01-08 DIAGNOSIS — G4709 Other insomnia: Secondary | ICD-10-CM

## 2017-01-08 MED ORDER — TRAZODONE HCL 50 MG PO TABS: 25.0000 mg | ORAL_TABLET | Freq: Every evening | ORAL | 0 refills | Status: DC | PRN

## 2017-02-14 ENCOUNTER — Other Ambulatory Visit: Payer: Self-pay | Admitting: Family Medicine

## 2017-02-14 DIAGNOSIS — K2950 Unspecified chronic gastritis without bleeding: Secondary | ICD-10-CM

## 2017-02-14 NOTE — Telephone Encounter (Signed)
Left voice message informing pt to schedule appt

## 2017-02-16 DIAGNOSIS — M7051 Other bursitis of knee, right knee: Secondary | ICD-10-CM | POA: Diagnosis not present

## 2017-02-20 ENCOUNTER — Encounter: Payer: Self-pay | Admitting: Family Medicine

## 2017-02-20 ENCOUNTER — Ambulatory Visit
Admission: RE | Admit: 2017-02-20 | Discharge: 2017-02-20 | Disposition: A | Discharge status: Home / Self Care | Payer: Federal, State, Local not specified - PPO | Source: Ambulatory Visit | Attending: Family Medicine | Admitting: Family Medicine

## 2017-02-20 ENCOUNTER — Ambulatory Visit: Payer: Federal, State, Local not specified - PPO | Admitting: Family Medicine

## 2017-02-20 VITALS — BP 114/68 | Temp 97.8°F | Resp 16 | Ht 66.0 in | Wt 148.9 lb

## 2017-02-20 DIAGNOSIS — M25561 Pain in right knee: Secondary | ICD-10-CM | POA: Insufficient documentation

## 2017-02-20 DIAGNOSIS — G43009 Migraine without aura, not intractable, without status migrainosus: Secondary | ICD-10-CM

## 2017-02-20 DIAGNOSIS — A6000 Herpesviral infection of urogenital system, unspecified: Secondary | ICD-10-CM | POA: Diagnosis not present

## 2017-02-20 DIAGNOSIS — D696 Thrombocytopenia, unspecified: Secondary | ICD-10-CM | POA: Diagnosis not present

## 2017-02-20 DIAGNOSIS — K2950 Unspecified chronic gastritis without bleeding: Secondary | ICD-10-CM | POA: Diagnosis not present

## 2017-02-20 LAB — URIC ACID: URIC ACID, SERUM: 3 mg/dL (ref 2.5–7.0)

## 2017-02-20 MED ORDER — VALACYCLOVIR HCL 500 MG PO TABS: ORAL_TABLET | ORAL | 1 refills | Status: DC

## 2017-02-20 NOTE — Progress Notes (Signed)
Name: Tammy Evans   MRN: [id number]    DOB: [birth year]   Date:02/20/2017       Progress Note  Subjective  Chief Complaint  Chief Complaint  Patient presents with  . Follow-up  . Gastritis    Takes medication as needed and watching her spicy food intake.   . Migraine  . Insomnia    Working 12-8:30 p.m. and getting enough sleep without taking medication.    HPI  Gastritis: she was doing well until last week when she was given Naproxen and Medrol dose pack for acute right knee pain and effusion. She has changed her diet, avoiding alcohol and also spicy food and has been taking Omeprazole or Ranitidine prn only. Advised to take it while taking prednisone , stop Naproxen for now and take Tylenol prn  GAD/Depression: feeling well, changed job positions and working day, no longer 3 rd shift, sleeping better and feeling well  Migraine : only two  episode since last visit, she stopped taking preventive medication, taking Imitrex prn. Headache is usually right frontal, throbbing, pulsating, and "it  feels like my  eye ball will come out of my face". Associated with photophobia, phonophobia and nausea. Imitrex works well for her, it takes about 45- 1 hour for the symptoms to resolve once she takes medication. No side effects of medication except that she needs to nap when she takes it.   Right knee pain: she states pain on and off on right anterior knee for years, however last week developed acute onset of pain, swelling and increase in warmth. She states it was hard to push on the gas paddle. She also states intermittently feels like her knee will give out. She went to Urgent Care, states no x-ray or labs were done. Still has mild antalgic gait, pain initially 10/10, but since started on Medrol dose pack pain is down to zero during rest and 6/10 when bearing weight. She has been back to work, stands at work, unable to work 10 hours but has been working 8 hours shift since last Friday. Out of work  from Tuesday till Friday last week.     Patient Active Problem List   Diagnosis Date Noted  . Leukopenia 08/08/2016  . Genital herpes 08/08/2016  . GAD (generalized anxiety disorder) 11/10/2015  . Migraine without aura and responsive to treatment 01/13/2015  . Insomnia, persistent 01/13/2015  . Night sweats 01/13/2015    Past Surgical History:  Procedure Laterality Date  . CESAREAN SECTION    . culposcopy    . TUBAL LIGATION      Family History  Problem Relation Age of Onset  . Diabetes Father   . Heart disease Father   . Hypertension Father   . Drug abuse Sister   . Sickle cell trait Daughter     Social History   Socioeconomic History  . Marital status: Single    Spouse name: Not on file  . Number of children: Not on file  . Years of education: Not on file  . Highest education level: Not on file  Social Needs  . Financial resource strain: Not on file  . Food insecurity - worry: Not on file  . Food insecurity - inability: Not on file  . Transportation needs - medical: Not on file  . Transportation needs - non-medical: Not on file  Occupational History  . Occupation: Firefighter  Tobacco Use  . Smoking status: Never Smoker  . Smokeless tobacco: Never Used  Substance and Sexual Activity  . Alcohol use: Yes    Alcohol/week: 0.0 oz    Comment: occasionally  . Drug use: No  . Sexual activity: Yes    Birth control/protection: Condom    Comment: Tubal Ligation   Other Topics Concern  . 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   She lives with boyfriend, she has grown children from previous relationship     Current Outpatient Medications:  .  methylPREDNISolone (MEDROL DOSEPAK) 4 MG TBPK tablet, See admin instructions., Disp: , Rfl: 0 .  naproxen (NAPROSYN) 500 MG tablet, Take 500 mg 2 (two) times daily by mouth., Disp: , Rfl: 0 .  omeprazole (PRILOSEC) 20 MG capsule, Take 1 capsule (20 mg total) by mouth  daily., Disp: 30 capsule, Rfl: 3 .  ranitidine (ZANTAC) 150 MG tablet, Take 1 tablet (150 mg total) by mouth 2 (two) times daily., Disp: 180 tablet, Rfl: 1 .  valACYclovir (VALTREX) 500 MG tablet, TAKE 1 TABLET (500 MG TOTAL) BY MOUTH DAILY. TAKE 3 TIMES A DAY FOR OUTBREAKS, Disp: 35 tablet, Rfl: 1 .  SUMAtriptan (IMITREX) 100 MG tablet, Take 1 tablet (100 mg total) by mouth once., Disp: 10 tablet, Rfl: 2  No Known Allergies   ROS  Constitutional: Negative for fever or weight change.  Respiratory: Negative for cough and shortness of breath.   Cardiovascular: Negative for chest pain or palpitations.  Gastrointestinal: Negative for abdominal pain, no bowel changes.  Musculoskeletal: Positive for gait problem or joint swelling.  Skin: Negative for rash.  Neurological: Negative for dizziness , positive for intermittent headache.  No other specific complaints in a complete review of systems (except as listed in HPI above).  Objective  Vitals:   02/20/17 0927  BP: 114/68  Resp: 16  Temp: 97.8 F (36.6 C)  TempSrc: Oral  Weight: 148 lb 14.4 oz (67.5 kg)  Height: 5\' 6"  (1.676 m)    Body mass index is 24.03 kg/m.  Physical Exam  Constitutional: Patient appears well-developed and well-nourished. No distress.  HEENT: head atraumatic, normocephalic, pupils equal and reactive to light, neck supple, throat within normal limits Cardiovascular: Normal rate, regular rhythm and normal heart sounds.  No murmur heard. No BLE edema. Pulmonary/Chest: Effort normal and breath sounds normal. No respiratory distress. Abdominal: Soft.  There is no tenderness. Psychiatric: Patient has a normal mood and affect. behavior is normal. Judgment and thought content normal. Muscular Skeletal: mild effusion of right knee, no redness or increase in warmth   PHQ2/9: Depression screen [facility] 2/9 02/20/2017 08/08/2016 01/05/2016 11/10/2015 01/13/2015  Decreased Interest 0 0 0 2 0  Down, Depressed, Hopeless 0 0 0 1 0   PHQ - 2 Score 0 0 0 3 0  Altered sleeping - - - 3 -  Tired, decreased energy - - - 1 -  Change in appetite - - - 0 -  Feeling bad or failure about yourself  - - - 0 -  Trouble concentrating - - - 2 -  Moving slowly or fidgety/restless - - - 2 -  Suicidal thoughts - - - 0 -  PHQ-9 Score - - - 11 -  Difficult doing work/chores - - - Somewhat difficult -     Fall Risk: Fall Risk  02/20/2017 08/08/2016 01/05/2016 11/10/2015 01/13/2015  Falls in the past year? No No No No No     Functional Status Survey: Is the patient deaf or have difficulty hearing?:  No Does the patient have difficulty seeing, even when wearing glasses/contacts?: No Does the patient have difficulty concentrating, remembering, or making decisions?: No Does the patient have difficulty walking or climbing stairs?: No Does the patient have difficulty dressing or bathing?: No Does the patient have difficulty doing errands alone such as visiting a doctor's office or shopping?: No    Assessment & Plan  1. Other chronic gastritis without hemorrhage  Doing better, avoiding alcohol and spicy food taking medication prn, advised to take it again while taking medrol dose pack  2. Migraine without aura and responsive to treatment  Doing well, continue prn medication  3. Genital herpes simplex, unspecified site  - valACYclovir (VALTREX) 500 MG tablet; TAKE 1 TABLET (500 MG TOTAL) BY MOUTH DAILY. TAKE 3 TIMES A DAY FOR OUTBREAKS  Dispense: 35 tablet; Refill: 1  4. Thrombocytopenia (HCC)  We will recheck during her CPE, taking prednisone now  5. Right anterior knee pain  Went to Urgent last week and was given medrol dose pack and also naproxen, taking both at the same time, advised to stop Naproxen until she finishes medrol dose pack .  - Uric acid - DG Knee Complete 4 Views Right; Future

## 2017-08-18 ENCOUNTER — Ambulatory Visit: Payer: Federal, State, Local not specified - PPO | Admitting: Family Medicine

## 2017-12-27 ENCOUNTER — Encounter: Payer: Self-pay | Admitting: Family Medicine

## 2017-12-27 ENCOUNTER — Ambulatory Visit: Payer: Federal, State, Local not specified - PPO | Admitting: Family Medicine

## 2017-12-27 VITALS — BP 118/72 | HR 70 | Temp 98.0°F | Resp 16 | Ht 66.0 in | Wt 144.3 lb

## 2017-12-27 DIAGNOSIS — D696 Thrombocytopenia, unspecified: Secondary | ICD-10-CM

## 2017-12-27 DIAGNOSIS — F325 Major depressive disorder, single episode, in full remission: Secondary | ICD-10-CM | POA: Diagnosis not present

## 2017-12-27 DIAGNOSIS — Z23 Encounter for immunization: Secondary | ICD-10-CM

## 2017-12-27 DIAGNOSIS — R232 Flushing: Secondary | ICD-10-CM

## 2017-12-27 DIAGNOSIS — F320 Major depressive disorder, single episode, mild: Secondary | ICD-10-CM

## 2017-12-27 DIAGNOSIS — G43009 Migraine without aura, not intractable, without status migrainosus: Secondary | ICD-10-CM

## 2017-12-27 DIAGNOSIS — D708 Other neutropenia: Secondary | ICD-10-CM | POA: Diagnosis not present

## 2017-12-27 DIAGNOSIS — Z13220 Encounter for screening for lipoid disorders: Secondary | ICD-10-CM

## 2017-12-27 DIAGNOSIS — Z131 Encounter for screening for diabetes mellitus: Secondary | ICD-10-CM

## 2017-12-27 DIAGNOSIS — N926 Irregular menstruation, unspecified: Secondary | ICD-10-CM

## 2017-12-27 DIAGNOSIS — G4700 Insomnia, unspecified: Secondary | ICD-10-CM

## 2017-12-27 MED ORDER — SUMATRIPTAN SUCCINATE 100 MG PO TABS: 100.0000 mg | ORAL_TABLET | Freq: Once | ORAL | 2 refills | Status: DC

## 2017-12-27 MED ORDER — TOPIRAMATE 25 MG PO TABS: 25.0000 mg | ORAL_TABLET | Freq: Every evening | ORAL | 0 refills | Status: DC

## 2017-12-27 MED ORDER — CLONIDINE HCL 0.1 MG PO TABS
0.1000 mg | ORAL_TABLET | Freq: Every day | ORAL | 3 refills | Status: DC
Start: 2017-12-27 — End: 2019-12-09

## 2017-12-27 NOTE — Progress Notes (Signed)
Name: Tammy PennerDeborah Yvette Evans   MRN: [id number]    DOB: 08/05/1970   Date:12/27/2017       Progress Note  Subjective  Chief Complaint  Chief Complaint  Patient presents with  . Migraine    Since September her migraines have become worse havd four the past month-has them with the Hot Flashes. Has been taking Excedrin Migraine with sleep to get rid of them  . Hot Flashes  . Follow-up    HPI  GAD/Depression: she is under more stress lately, missing work because of frequent migraines, also had a confrontation with co-worker and trying to buy a house. She states hot flashes are causing lack of sleep , she has bene moody than usual, and feeling tired. She has been taking tylenol pm every night and still not sleeping well. Denies suicidal thoughts or ideation  Migraine : Headache is usually right frontal, throbbing, pulsating, and "it feels like my eye ball will come out of my face". Associated with photophobia, phonophobia and nausea. Imitrex works well for her, it takes about 45- 1 hour for the symptoms to resolve once she takes medication. She states episodes were seldom, but had 4 severe episodes this month already. She has not been taking prophylactic mediation. She states it can last all day and is out of Imitrex.   Hot flashes: it started months ago, but getting more frequent. Initially just at night but now also happens during the day. Waking up at times drenched in sweat. Skipped her first cycle 12/17/2017. She had tubal ligation.   Low platelets and white count: recheck today, she states she seems to bruise easily, no blood in stools or gum bleeding.   Patient Active Problem List   Diagnosis Date Noted  . Major depression in remission (HCC) 12/27/2017  . Other neutropenia (HCC) 08/08/2016  . Genital herpes 08/08/2016  . GAD (generalized anxiety disorder) 11/10/2015  . Migraine without aura and responsive to treatment 01/13/2015  . Insomnia, persistent 01/13/2015  . Night sweats  01/13/2015    Past Surgical History:  Procedure Laterality Date  . CESAREAN SECTION    . culposcopy    . TUBAL LIGATION      Family History  Problem Relation Age of Onset  . Diabetes Father   . Heart disease Father   . Hypertension Father   . Drug abuse Sister   . Sickle cell trait Daughter     Social History   Socioeconomic History  . Marital status: Single    Spouse name: Not on file  . Number of children: Not on file  . Years of education: Not on file  . Highest education level: Not on file  Occupational History  . Occupation: Firefighterpostal service employee  Social Needs  . Financial resource strain: Not on file  . Food insecurity:    Worry: Not on file    Inability: Not on file  . Transportation needs:    Medical: Not on file    Non-medical: Not on file  Tobacco Use  . Smoking status: Never Smoker  . Smokeless tobacco: Never Used  Substance and Sexual Activity  . Alcohol use: Yes    Alcohol/week: 0.0 standard drinks    Comment: occasionally  . Drug use: No  . Sexual activity: Yes    Birth control/protection: Condom    Comment: Tubal Ligation   Lifestyle  . Physical activity:    Days per week: Not on file    Minutes per session: Not on file  .  Stress: Not on file  Relationships  . Social connections:    Talks on phone: Not on file    Gets together: Not on file    Attends religious service: Not on file    Active member of club or organization: Not on file    Attends meetings of clubs or organizations: Not on file    Relationship status: Not on file  . Intimate partner violence:    Fear of current or ex partner: Not on file    Emotionally abused: Not on file    Physically abused: Not on file    Forced sexual activity: Not on file  Other Topics Concern  . 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   She lives with boyfriend, she has grown children from previous relationship     Current Outpatient Medications:  .   valACYclovir (VALTREX) 500 MG tablet, TAKE 1 TABLET (500 MG TOTAL) BY MOUTH DAILY. TAKE 3 TIMES A DAY FOR OUTBREAKS, Disp: 35 tablet, Rfl: 1 .  cloNIDine (CATAPRES) 0.1 MG tablet, Take 1 tablet (0.1 mg total) by mouth at bedtime., Disp: 30 tablet, Rfl: 3 .  SUMAtriptan (IMITREX) 100 MG tablet, Take 1 tablet (100 mg total) by mouth once for 1 dose., Disp: 10 tablet, Rfl: 2 .  topiramate (TOPAMAX) 25 MG tablet, Take 1-4 tablets (25-100 mg total) by mouth every evening., Disp: 120 tablet, Rfl: 0  No Known Allergies  I personally reviewed active problem list, medication list, allergies, family history, social history with the patient/caregiver today.   ROS  Constitutional: Negative for fever or weight change.  Respiratory: Negative for cough and shortness of breath.   Cardiovascular: Negative for chest pain or palpitations.  Gastrointestinal: Negative for abdominal pain, no bowel changes.  Musculoskeletal: Negative for gait problem or joint swelling.  Skin: Negative for rash.  Neurological: Negative for dizziness or headache.  No other specific complaints in a complete review of systems (except as listed in HPI above).  Objective  Vitals:   12/27/17 1108  BP: 118/72  Pulse: 70  Resp: 16  Temp: 98 F (36.7 C)  TempSrc: Oral  SpO2: 99%  Weight: 144 lb 4.8 oz (65.5 kg)  Height: 5\' 6"  (1.676 m)    Body mass index is 23.29 kg/m.  Physical Exam  Constitutional: Patient appears well-developed and well-nourished. Obese  No distress.  HEENT: head atraumatic, normocephalic, pupils equal and reactive to light, neck supple, throat within normal limits Cardiovascular: Normal rate, regular rhythm and normal heart sounds.  No murmur heard. No BLE edema. Pulmonary/Chest: Effort normal and breath sounds normal. No respiratory distress. Abdominal: Soft.  There is no tenderness. Psychiatric: Patient has a normal mood and affect. behavior is normal. Judgment and thought content  normal. Neurological:  Normal cranial nerves, normal strength   PHQ2/9: Depression screen [facility] 2/9 12/27/2017 12/27/2017 02/20/2017 08/08/2016 01/05/2016  Decreased Interest 0 0 0 0 0  Down, Depressed, Hopeless 0 0 0 0 0  PHQ - 2 Score 0 0 0 0 0  Altered sleeping 3 - - - -  Tired, decreased energy 2 - - - -  Change in appetite 0 - - - -  Feeling bad or failure about yourself  0 - - - -  Trouble concentrating 0 - - - -  Moving slowly or fidgety/restless 0 - - - -  Suicidal thoughts 0 - - - -  PHQ-9 Score 5 - - - -  Difficult doing work/chores Not difficult at all - - - -    Fall Risk: Fall Risk  12/27/2017 02/20/2017 08/08/2016 01/05/2016 11/10/2015  Falls in the past year? No No No No No    Functional Status Survey: Is the patient deaf or have difficulty hearing?: No Does the patient have difficulty seeing, even when wearing glasses/contacts?: Yes Does the patient have difficulty concentrating, remembering, or making decisions?: No Does the patient have difficulty walking or climbing stairs?: No Does the patient have difficulty dressing or bathing?: No Does the patient have difficulty doing errands alone such as visiting a doctor's office or shopping?: No   Assessment & Plan  1. Migraine without aura and responsive to treatment  - SUMAtriptan (IMITREX) 100 MG tablet; Take 1 tablet (100 mg total) by mouth once for 1 dose.  Dispense: 10 tablet; Refill: 2 - topiramate (TOPAMAX) 25 MG tablet; Take 1-4 tablets (25-100 mg total) by mouth every evening.  Dispense: 120 tablet; Refill: 0  2. Need for immunization against influenza  - Flu Vaccine QUAD 6+ mos PF IM (Fluarix Quad PF)  3. Other neutropenia (HCC)  Recheck labs  4. Major depression Mild (HCC)  - COMPLETE METABOLIC PANEL WITH GFR Discussed Effexor for depression and hot flashes but she wants to hold off for now   5. Thrombocytopenia (HCC)  - CBC with Differential/Platelet  6. Hot flashes  - TSH - cloNIDine  (CATAPRES) 0.1 MG tablet; Take 1 tablet (0.1 mg total) by mouth at bedtime.  Dispense: 30 tablet; Refill: 3  7. Missed period  - TSH  8. Lipid screening  - Lipid panel  9. Diabetes mellitus screening  - Hemoglobin A1c  10. Insomnia, unspecified type  - cloNIDine (CATAPRES) 0.1 MG tablet; Take 1 tablet (0.1 mg total) by mouth at bedtime.  Dispense: 30 tablet; Refill: 3

## 2017-12-28 LAB — LIPID PANEL
CHOLESTEROL: 210 mg/dL — AB (ref <200)
HDL: 70 mg/dL (ref >50)
LDL Cholesterol (Calc): 120 mg/dL (calc) — ABNORMAL HIGH
Non-HDL Cholesterol (Calc): 140 mg/dL (calc) — ABNORMAL HIGH (ref <130)
TRIGLYCERIDES: 94 mg/dL (ref <150)
Total CHOL/HDL Ratio: 3 (calc) (ref <5.0)

## 2017-12-28 LAB — CBC WITH DIFFERENTIAL/PLATELET
BASOS ABS: 19 {cells}/uL (ref 0–200)
Basophils Relative: 0.6 %
Eosinophils Absolute: 50 cells/uL (ref 15–500)
Eosinophils Relative: 1.6 %
HEMATOCRIT: 39 % (ref 35.0–45.0)
HEMOGLOBIN: 13.3 g/dL (ref 11.7–15.5)
Lymphs Abs: 853 cells/uL (ref 850–3900)
MCH: 30.1 pg (ref 27.0–33.0)
MCHC: 34.1 g/dL (ref 32.0–36.0)
MCV: 88.2 fL (ref 80.0–100.0)
MPV: 11.2 fL (ref 7.5–12.5)
Monocytes Relative: 9.4 %
NEUTROS ABS: 1888 {cells}/uL (ref 1500–7800)
NEUTROS PCT: 60.9 %
Platelets: 169 10*3/uL (ref 140–400)
RBC: 4.42 10*6/uL (ref 3.80–5.10)
RDW: 13.1 % (ref 11.0–15.0)
Total Lymphocyte: 27.5 %
WBC: 3.1 10*3/uL — ABNORMAL LOW (ref 3.8–10.8)
WBCMIX: 291 {cells}/uL (ref 200–950)

## 2017-12-28 LAB — COMPLETE METABOLIC PANEL WITH GFR
AG RATIO: 1.8 (calc) (ref 1.0–2.5)
ALBUMIN MSPROF: 4.5 g/dL (ref 3.6–5.1)
ALKALINE PHOSPHATASE (APISO): 50 U/L (ref 33–115)
ALT: 16 U/L (ref 6–29)
AST: 17 U/L (ref 10–35)
BUN: 12 mg/dL (ref 7–25)
CO2: 26 mmol/L (ref 20–32)
Calcium: 9.2 mg/dL (ref 8.6–10.2)
Chloride: 104 mmol/L (ref 98–110)
Creat: 1 mg/dL (ref 0.50–1.10)
GFR, EST NON AFRICAN AMERICAN: 66 mL/min/{1.73_m2} (ref >=60)
GFR, Est African American: 77 mL/min/{1.73_m2} (ref >=60)
GLOBULIN: 2.5 g/dL (ref 1.9–3.7)
Glucose, Bld: 81 mg/dL (ref 65–99)
Potassium: 4.2 mmol/L (ref 3.5–5.3)
Sodium: 139 mmol/L (ref 135–146)
Total Bilirubin: 1.3 mg/dL — ABNORMAL HIGH (ref 0.2–1.2)
Total Protein: 7 g/dL (ref 6.1–8.1)

## 2017-12-28 LAB — HEMOGLOBIN A1C
EAG (MMOL/L): 6.5 (calc)
Hgb A1c MFr Bld: 5.7 % of total Hgb — ABNORMAL HIGH (ref <5.7)
MEAN PLASMA GLUCOSE: 117 (calc)

## 2017-12-28 LAB — TSH: TSH: 0.86 m[IU]/L

## 2018-01-01 ENCOUNTER — Encounter: Payer: Self-pay | Admitting: Family Medicine

## 2018-01-01 DIAGNOSIS — R7303 Prediabetes: Secondary | ICD-10-CM | POA: Insufficient documentation

## 2018-01-26 ENCOUNTER — Ambulatory Visit: Payer: Federal, State, Local not specified - PPO | Admitting: Family Medicine

## 2018-04-08 DIAGNOSIS — J00 Acute nasopharyngitis [common cold]: Secondary | ICD-10-CM | POA: Diagnosis not present

## 2018-05-14 ENCOUNTER — Emergency Department (HOSPITAL_BASED_OUTPATIENT_CLINIC_OR_DEPARTMENT_OTHER)
Admission: EM | Admit: 2018-05-14 | Discharge: 2018-05-14 | Disposition: A | Discharge status: Home / Self Care | Payer: Federal, State, Local not specified - PPO | Attending: Emergency Medicine | Admitting: Emergency Medicine

## 2018-05-14 ENCOUNTER — Other Ambulatory Visit: Payer: Self-pay

## 2018-05-14 ENCOUNTER — Encounter (HOSPITAL_BASED_OUTPATIENT_CLINIC_OR_DEPARTMENT_OTHER): Payer: Self-pay

## 2018-05-14 ENCOUNTER — Ambulatory Visit: Payer: Self-pay | Admitting: *Deleted

## 2018-05-14 DIAGNOSIS — Z79899 Other long term (current) drug therapy: Secondary | ICD-10-CM | POA: Insufficient documentation

## 2018-05-14 DIAGNOSIS — N939 Abnormal uterine and vaginal bleeding, unspecified: Secondary | ICD-10-CM | POA: Diagnosis not present

## 2018-05-14 DIAGNOSIS — R1030 Lower abdominal pain, unspecified: Secondary | ICD-10-CM | POA: Diagnosis not present

## 2018-05-14 DIAGNOSIS — R531 Weakness: Secondary | ICD-10-CM | POA: Diagnosis not present

## 2018-05-14 LAB — URINALYSIS, ROUTINE W REFLEX MICROSCOPIC
BILIRUBIN URINE: NEGATIVE
GLUCOSE, UA: NEGATIVE mg/dL
KETONES UR: NEGATIVE mg/dL
Leukocytes, UA: NEGATIVE
Nitrite: NEGATIVE
PH: 7 (ref 5.0–8.0)
Protein, ur: NEGATIVE mg/dL
SPECIFIC GRAVITY, URINE: 1.02 (ref 1.005–1.030)

## 2018-05-14 LAB — WET PREP, GENITAL
Clue Cells Wet Prep HPF POC: NONE SEEN
SPERM: NONE SEEN
Trich, Wet Prep: NONE SEEN
Yeast Wet Prep HPF POC: NONE SEEN

## 2018-05-14 LAB — BASIC METABOLIC PANEL
ANION GAP: 6 (ref 5–15)
BUN: 18 mg/dL (ref 6–20)
CHLORIDE: 105 mmol/L (ref 98–111)
CO2: 24 mmol/L (ref 22–32)
CREATININE: 0.95 mg/dL (ref 0.44–1.00)
Calcium: 8.6 mg/dL — ABNORMAL LOW (ref 8.9–10.3)
GFR calc non Af Amer: >60 mL/min (ref >60)
Glucose, Bld: 88 mg/dL (ref 70–99)
Potassium: 3.9 mmol/L (ref 3.5–5.1)
SODIUM: 135 mmol/L (ref 135–145)

## 2018-05-14 LAB — CBC WITH DIFFERENTIAL/PLATELET
Abs Immature Granulocytes: 0 10*3/uL (ref 0.00–0.07)
BASOS ABS: 0 10*3/uL (ref 0.0–0.1)
Basophils Relative: 0 %
EOS ABS: 0.1 10*3/uL (ref 0.0–0.5)
EOS PCT: 1 %
HCT: 32.7 % — ABNORMAL LOW (ref 36.0–46.0)
HEMOGLOBIN: 10.6 g/dL — AB (ref 12.0–15.0)
IMMATURE GRANULOCYTES: 0 %
LYMPHS ABS: 1 10*3/uL (ref 0.7–4.0)
LYMPHS PCT: 21 %
MCH: 30.3 pg (ref 26.0–34.0)
MCHC: 32.4 g/dL (ref 30.0–36.0)
MCV: 93.4 fL (ref 80.0–100.0)
Monocytes Absolute: 0.3 10*3/uL (ref 0.1–1.0)
Monocytes Relative: 7 %
NEUTROS PCT: 71 %
NRBC: 0 % (ref 0.0–0.2)
Neutro Abs: 3.3 10*3/uL (ref 1.7–7.7)
Platelets: 178 10*3/uL (ref 150–400)
RBC: 3.5 MIL/uL — AB (ref 3.87–5.11)
RDW: 13.2 % (ref 11.5–15.5)
WBC: 4.7 10*3/uL (ref 4.0–10.5)

## 2018-05-14 LAB — PREGNANCY, URINE: Preg Test, Ur: NEGATIVE

## 2018-05-14 LAB — URINALYSIS, MICROSCOPIC (REFLEX): WBC, UA: NONE SEEN WBC/hpf (ref 0–5)

## 2018-05-14 MED ORDER — NORETHINDRONE ACETATE 5 MG PO TABS: 5.0000 mg | ORAL_TABLET | Freq: Every day | ORAL | 0 refills | Status: DC

## 2018-05-14 MED ORDER — SODIUM CHLORIDE 0.9 % IV BOLUS
1000.0000 mL | Freq: Once | INTRAVENOUS | Status: AC
  Administered 2018-05-14: 1000 mL via INTRAVENOUS

## 2018-05-14 NOTE — Telephone Encounter (Signed)
Patient states she started 04/25/18- second cycle for January. ( 04/13/18- lasted 3 days normal) Heavy bleeding and passing clots since -04/25/18. Patient reports she has had bleeding that messed her clothes up. Patient reports 7 days of messing up clothes. Patient is using pads- she is changing a pad every hour- soak - heavy flow pad. Patient is feeling weak and tired. She does not see the bleeding letting up. No appointment available at office- advised UC/ED. Patient is going to go.  Reason for Disposition . MODERATE vaginal bleeding (i.e., soaking 1 pad or tampon per hour and present > 6 hours; 1 menstrual cup every 6 hours)  Answer Assessment - Initial Assessment Questions 1. AMOUNT: "Describe the bleeding that you are having."    - SPOTTING: spotting, or pinkish / brownish mucous discharge; does not fill panti-liner or pad    - MILD:  less than 1 pad / hour; less than patient's usual menstrual bleeding   - MODERATE: 1-2 pads / hour; 1 menstrual cup every 6 hours; small-medium blood clots (e.g., pea, grape, small coin)   - SEVERE: soaking 2 or more pads/hour for 2 or more hours; 1 menstrual cup every 2 hours; bleeding not contained by pads or continuous red blood from vagina; large blood clots (e.g., golf ball, large coin)      Moderate bleeding 2. ONSET: "When did the bleeding begin?" "Is it continuing now?"     04/25/18 3. MENSTRUAL PERIOD: "When was the last normal menstrual period?" "How is this different than your period?"     04/13/18- prolonged bleeding 4. REGULARITY: "How regular are your periods?"     Monthly cycles regular 5. ABDOMINAL PAIN: "Do you have any pain?" "How bad is the pain?"  (e.g., Scale 1-10; mild, moderate, or severe)   - MILD (1-3): doesn't interfere with normal activities, abdomen soft and not tender to touch    - MODERATE (4-7): interferes with normal activities or awakens from sleep, tender to touch    - SEVERE (8-10): excruciating pain, doubled over, unable to do  any normal activities      Yes- when started 10- now it is 7 6. PREGNANCY: "Could you be pregnant?" "Are you sexually active?" "Did you recently give birth?"     No- BTL 7. BREASTFEEDING: "Are you breastfeeding?"     no 8. HORMONES: "Are you taking any hormone medications, prescription or OTC?" (e.g., birth control pills, estrogen)     no 9. BLOOD THINNERS: "Do you take any blood thinners?" (e.g., Coumadin/warfarin, Pradaxa/dabigatran, aspirin)     no 10. CAUSE: "What do you think is causing the bleeding?" (e.g., recent gyn surgery, recent gyn procedure; known bleeding disorder, cervical cancer, polycystic ovarian disease, fibroids)         Family history of fibroids 11. HEMODYNAMIC STATUS: "Are you weak or feeling lightheaded?" If so, ask: "Can you stand and walk normally?"        Yes- very tired, patient is standing and walking normally 12. OTHER SYMPTOMS: "What other symptoms are you having with the bleeding?" (e.g., passed tissue, vaginal discharge, fever, menstrual-type cramps)       Big blood clots, cramping  Protocols used: VAGINAL BLEEDING - ABNORMAL-A-AH

## 2018-05-14 NOTE — ED Triage Notes (Signed)
Pt c/o vaginal bleeding since 1/15, states she is weak and dizzy and has been soaking through an overnight pad each hour with blood clots and cramping

## 2018-05-14 NOTE — ED Provider Notes (Signed)
[facility] EMERGENCY DEPARTMENT Provider Note   CSN: [id number] Arrival date & time: 05/14/18  1712     History   Chief Complaint Chief Complaint  Patient presents with  . Vaginal Bleeding    HPI Tammy Evans is a 48 y.o. female who presents to ED for evaluation of vaginal bleeding since 04/25/2018.  She had her normal menstrual cycle from 1/3-1/7 with a normal amount of bleeding.  She began having bleeding again on 1/15.  States that at the beginning she was soaking through 1 pad every hour.  She began having weakness, fatigue.  States that she was passing clots.  She called her primary care provider today and was told to come to the ED.  She does note that since waking up today the bleeding has improved greatly.  Reports associated lower abdominal cramping.  No history of similar symptoms in the past.  She denies any vomiting, nausea, change to bowel movements, anticoagulant use, history of needing blood transfusion.  She was told in the past that she was anemic but is not on any iron supplementation.  HPI  Past Medical History:  Diagnosis Date  . Chronic insomnia   . Headache, common migraine   . Menopausal state     Patient Active Problem List   Diagnosis Date Noted  . Pre-diabetes 01/01/2018  . Major depression in remission (HCC) 12/27/2017  . Other neutropenia (HCC) 08/08/2016  . Genital herpes 08/08/2016  . GAD (generalized anxiety disorder) 11/10/2015  . Migraine without aura and responsive to treatment 01/13/2015  . Insomnia, persistent 01/13/2015  . Night sweats 01/13/2015    Past Surgical History:  Procedure Laterality Date  . CESAREAN SECTION    . culposcopy    . TUBAL LIGATION       OB History   No obstetric history on file.      Home Medications    Prior to Admission medications   Medication Sig Start Date End Date Taking? Authorizing Provider  cloNIDine (CATAPRES) 0.1 MG tablet Take 1 tablet (0.1 mg total) by mouth at bedtime.  12/27/17   Alba CorySowles, Krichna, MD  norethindrone (AYGESTIN) 5 MG tablet Take 1 tablet (5 mg total) by mouth daily. 05/14/18   Evoleth Nordmeyer, PA-C  SUMAtriptan (IMITREX) 100 MG tablet Take 1 tablet (100 mg total) by mouth once for 1 dose. 12/27/17 12/27/17  Alba CorySowles, Krichna, MD  topiramate (TOPAMAX) 25 MG tablet Take 1-4 tablets (25-100 mg total) by mouth every evening. 12/27/17   Alba CorySowles, Krichna, MD  valACYclovir (VALTREX) 500 MG tablet TAKE 1 TABLET (500 MG TOTAL) BY MOUTH DAILY. TAKE 3 TIMES A DAY FOR OUTBREAKS 02/20/17   Alba CorySowles, Krichna, MD    Family History Family History  Problem Relation Age of Onset  . Diabetes Father   . Heart disease Father   . Hypertension Father   . Drug abuse Sister   . Sickle cell trait Daughter     Social History Social History   Tobacco Use  . Smoking status: Never Smoker  . Smokeless tobacco: Never Used  Substance Use Topics  . Alcohol use: Yes    Alcohol/week: 0.0 standard drinks    Comment: occasionally  . Drug use: No     Allergies   Patient has no known allergies.   Review of Systems Review of Systems  Constitutional: Negative for appetite change, chills and fever.  HENT: Negative for ear pain, rhinorrhea, sneezing and sore throat.   Eyes: Negative for photophobia and visual disturbance.  Respiratory: Negative for cough, chest tightness, shortness of breath and wheezing.   Cardiovascular: Negative for chest pain and palpitations.  Gastrointestinal: Positive for abdominal pain. Negative for blood in stool, constipation, diarrhea, nausea and vomiting.  Genitourinary: Positive for vaginal bleeding. Negative for dysuria, hematuria and urgency.  Musculoskeletal: Negative for myalgias.  Skin: Negative for rash.  Neurological: Negative for dizziness, weakness and light-headedness.     Physical Exam Updated Vital Signs BP 134/76 (BP Location: Right Arm)   Pulse 78   Temp 98.3 F (36.8 C) (Oral)   Resp 16   Ht 5' 5.75" (1.67 m)   Wt 65.8 kg    LMP 04/25/2018   SpO2 100%   BMI 23.58 kg/m   Physical Exam Vitals signs and nursing note reviewed. Exam conducted with a chaperone present.  Constitutional:      General: She is not in acute distress.    Appearance: She is well-developed.  HENT:     Head: Normocephalic and atraumatic.     Nose: Nose normal.  Eyes:     General: No scleral icterus.       Left eye: No discharge.     Conjunctiva/sclera: Conjunctivae normal.  Neck:     Musculoskeletal: Normal range of motion and neck supple.  Cardiovascular:     Rate and Rhythm: Normal rate and regular rhythm.     Heart sounds: Normal heart sounds. No murmur. No friction rub. No gallop.   Pulmonary:     Effort: Pulmonary effort is normal. No respiratory distress.     Breath sounds: Normal breath sounds.  Abdominal:     General: Bowel sounds are normal. There is no distension.     Palpations: Abdomen is soft.     Tenderness: There is abdominal tenderness (suprapubic). There is no guarding.  Genitourinary:    Vagina: Bleeding present.     Cervix: No cervical motion tenderness.     Uterus: Not tender.      Adnexa:        Right: No tenderness.         Left: No tenderness.       Comments: Pelvic exam: normal external genitalia without evidence of trauma. VULVA: normal appearing vulva with no masses, tenderness or lesion. VAGINA: normal appearing vagina with normal color and dark red blood noted in vaginal vault which was cleared with 2 fox swabs. CERVIX: normal appearing cervix without lesions, cervical motion tenderness absent, cervical os closed with out purulent discharge; No vaginal discharge. Wet prep and DNA probe for chlamydia and GC obtained.   ADNEXA: normal adnexa in size, nontender and no masses UTERUS: uterus is normal size, shape, consistency and nontender.   Musculoskeletal: Normal range of motion.  Skin:    General: Skin is warm and dry.     Findings: No rash.  Neurological:     Mental Status: She is alert.      Motor: No abnormal muscle tone.     Coordination: Coordination normal.      ED Treatments / Results  Labs (all labs ordered are listed, but only abnormal results are displayed) Labs Reviewed  WET PREP, GENITAL - Abnormal; Notable for the following components:      Result Value   WBC, Wet Prep HPF POC MODERATE (*)    All other components within normal limits  URINALYSIS, ROUTINE W REFLEX MICROSCOPIC - Abnormal; Notable for the following components:   Hgb urine dipstick LARGE (*)    All other components within normal limits  URINALYSIS, MICROSCOPIC (REFLEX) - Abnormal; Notable for the following components:   Bacteria, UA RARE (*)    All other components within normal limits  BASIC METABOLIC PANEL - Abnormal; Notable for the following components:   Calcium 8.6 (*)    All other components within normal limits  CBC WITH DIFFERENTIAL/PLATELET - Abnormal; Notable for the following components:   RBC 3.50 (*)    Hemoglobin 10.6 (*)    HCT 32.7 (*)    All other components within normal limits  PREGNANCY, URINE  GC/CHLAMYDIA PROBE AMP ([place]) NOT AT [facility]    EKG None  Radiology No results found.  Procedures Procedures (including critical care time)  Medications Ordered in ED Medications  sodium chloride 0.9 % bolus 1,000 mL ( Intravenous Stopped 05/14/18 2030)     Initial Impression / Assessment and Plan / ED Course  I have reviewed the triage vital signs and the nursing notes.  Pertinent labs & imaging results that were available during my care of the patient were reviewed by me and considered in my medical decision making (see chart for details).     48 year old female presents to ED for abnormal vaginal bleeding since 04/25/2018.  She completed her normal period about 1 month ago.  She then started bleeding again.  She reports lower abdominal cramping and generalized weakness.  States that the pain and bleeding has eased up today.  On my pelvic exam there is dark red  blood noted in the vaginal vault but no clots.  Cervix is normal.  No cervical motion tenderness, adnexal tenderness or uterine tenderness.  Lab work shows hemoglobin of 10.6, compared to 4 months ago when it was 13.  Wet prep unremarkable.  Urinalysis, BMP unremarkable.  Pregnancy test is negative.  Spoke to OB/GYN at [facility] who recommends placing patient on Aygestin 5 mg daily until she follows up with her OB/GYN.  Patient is agreeable to this plan and is requesting discharge home.  She reports improvement with fluids given.  Patient is hemodynamically stable, in NAD, and able to ambulate in the ED. Evaluation does not show pathology that would require ongoing emergent intervention or inpatient treatment. I explained the diagnosis to the patient. Pain has been managed and has no complaints prior to discharge. Patient is comfortable with above plan and is stable for discharge at this time. All questions were answered prior to disposition. Strict return precautions for returning to the ED were discussed. Encouraged follow up with PCP.    Portions of this note were generated with Scientist, clinical (histocompatibility and immunogenetics). Dictation errors may occur despite best attempts at proofreading.   Final Clinical Impressions(s) / ED Diagnoses   Final diagnoses:  Abnormal vaginal bleeding    ED Discharge Orders         Ordered    norethindrone (AYGESTIN) 5 MG tablet  Daily     05/14/18 2104           Dietrich Pates, PA-C 05/14/18 2107    Mesner, Barbara Cower, MD 05/15/18 [phone]

## 2018-05-14 NOTE — Discharge Instructions (Signed)
You will need to take the prescribed medication daily until you follow-up with your OB/GYN. Please call your OB/GYN tomorrow to schedule an appointment. Return to ED for worsening symptoms, increased bleeding, lightheadedness, loss of consciousness or severe abdominal pain.

## 2018-05-14 NOTE — ED Notes (Signed)
C/o vag bleeding x 17 days w abd pain  Denies n/v/d

## 2018-05-15 LAB — GC/CHLAMYDIA PROBE AMP (~~LOC~~) NOT AT ARMC
CHLAMYDIA, DNA PROBE: NEGATIVE
NEISSERIA GONORRHEA: NEGATIVE

## 2018-11-05 DIAGNOSIS — N390 Urinary tract infection, site not specified: Secondary | ICD-10-CM | POA: Diagnosis not present

## 2019-07-11 DIAGNOSIS — Z20828 Contact with and (suspected) exposure to other viral communicable diseases: Secondary | ICD-10-CM | POA: Diagnosis not present

## 2019-07-11 DIAGNOSIS — Z03818 Encounter for observation for suspected exposure to other biological agents ruled out: Secondary | ICD-10-CM | POA: Diagnosis not present

## 2019-11-29 ENCOUNTER — Encounter (HOSPITAL_BASED_OUTPATIENT_CLINIC_OR_DEPARTMENT_OTHER): Payer: Self-pay | Admitting: *Deleted

## 2019-11-29 ENCOUNTER — Emergency Department (HOSPITAL_BASED_OUTPATIENT_CLINIC_OR_DEPARTMENT_OTHER)
Admission: EM | Admit: 2019-11-29 | Discharge: 2019-11-29 | Disposition: A | Discharge status: Home / Self Care | Payer: Federal, State, Local not specified - PPO | Attending: Emergency Medicine | Admitting: Emergency Medicine

## 2019-11-29 ENCOUNTER — Emergency Department (HOSPITAL_BASED_OUTPATIENT_CLINIC_OR_DEPARTMENT_OTHER): Payer: Federal, State, Local not specified - PPO

## 2019-11-29 ENCOUNTER — Other Ambulatory Visit: Payer: Self-pay

## 2019-11-29 DIAGNOSIS — T148XXA Other injury of unspecified body region, initial encounter: Secondary | ICD-10-CM

## 2019-11-29 DIAGNOSIS — Y929 Unspecified place or not applicable: Secondary | ICD-10-CM | POA: Insufficient documentation

## 2019-11-29 DIAGNOSIS — Y939 Activity, unspecified: Secondary | ICD-10-CM | POA: Insufficient documentation

## 2019-11-29 DIAGNOSIS — S8012XA Contusion of left lower leg, initial encounter: Secondary | ICD-10-CM | POA: Diagnosis not present

## 2019-11-29 DIAGNOSIS — Y999 Unspecified external cause status: Secondary | ICD-10-CM | POA: Insufficient documentation

## 2019-11-29 DIAGNOSIS — X58XXXA Exposure to other specified factors, initial encounter: Secondary | ICD-10-CM | POA: Insufficient documentation

## 2019-11-29 DIAGNOSIS — S86112A Strain of other muscle(s) and tendon(s) of posterior muscle group at lower leg level, left leg, initial encounter: Secondary | ICD-10-CM | POA: Insufficient documentation

## 2019-11-29 DIAGNOSIS — S86912A Strain of unspecified muscle(s) and tendon(s) at lower leg level, left leg, initial encounter: Secondary | ICD-10-CM | POA: Diagnosis not present

## 2019-11-29 DIAGNOSIS — M79662 Pain in left lower leg: Secondary | ICD-10-CM

## 2019-11-29 LAB — CBC WITH DIFFERENTIAL/PLATELET
Abs Immature Granulocytes: 0 10*3/uL (ref 0.00–0.07)
Basophils Absolute: 0 10*3/uL (ref 0.0–0.1)
Basophils Relative: 0 %
Eosinophils Absolute: 0.1 10*3/uL (ref 0.0–0.5)
Eosinophils Relative: 2 %
HCT: 38.5 % (ref 36.0–46.0)
Hemoglobin: 12.7 g/dL (ref 12.0–15.0)
Immature Granulocytes: 0 %
Lymphocytes Relative: 26 %
Lymphs Abs: 0.9 10*3/uL (ref 0.7–4.0)
MCH: 30.2 pg (ref 26.0–34.0)
MCHC: 33 g/dL (ref 30.0–36.0)
MCV: 91.4 fL (ref 80.0–100.0)
Monocytes Absolute: 0.4 10*3/uL (ref 0.1–1.0)
Monocytes Relative: 13 %
Neutro Abs: 1.9 10*3/uL (ref 1.7–7.7)
Neutrophils Relative %: 59 %
Platelets: 143 10*3/uL — ABNORMAL LOW (ref 150–400)
RBC: 4.21 MIL/uL (ref 3.87–5.11)
RDW: 12.8 % (ref 11.5–15.5)
WBC: 3.3 10*3/uL — ABNORMAL LOW (ref 4.0–10.5)
nRBC: 0 % (ref 0.0–0.2)

## 2019-11-29 LAB — COMPREHENSIVE METABOLIC PANEL
ALT: 22 U/L (ref 0–44)
AST: 22 U/L (ref 15–41)
Albumin: 4.2 g/dL (ref 3.5–5.0)
Alkaline Phosphatase: 79 U/L (ref 38–126)
Anion gap: 8 (ref 5–15)
BUN: 18 mg/dL (ref 6–20)
CO2: 27 mmol/L (ref 22–32)
Calcium: 9 mg/dL (ref 8.9–10.3)
Chloride: 100 mmol/L (ref 98–111)
Creatinine, Ser: 0.98 mg/dL (ref 0.44–1.00)
GFR calc Af Amer: >60 mL/min (ref >60)
GFR calc non Af Amer: >60 mL/min (ref >60)
Glucose, Bld: 83 mg/dL (ref 70–99)
Potassium: 4.2 mmol/L (ref 3.5–5.1)
Sodium: 135 mmol/L (ref 135–145)
Total Bilirubin: 1.2 mg/dL (ref 0.3–1.2)
Total Protein: 7.2 g/dL (ref 6.5–8.1)

## 2019-11-29 MED ORDER — ACETAMINOPHEN 325 MG PO TABS
650.0000 mg | ORAL_TABLET | Freq: Once | ORAL | Status: AC
  Administered 2019-11-29: 650 mg via ORAL
  Filled 2019-11-29: qty 2

## 2019-11-29 NOTE — Discharge Instructions (Signed)
Your work-up today was reassuring, there was no evidence of a clot in your calf.  I want you to follow-up with your primary care about this.  I think that you could have pulled a muscle in your calf causing you to have your symptoms, I want you to ice this area and take Aleve for the next 10 days.  If you have any new or worsening concerning symptoms please come back to the emergency department, these include chest pain or shortness of breath.  Try and rest this area, please use the attached instructions.  As we spoke about your platelets and your white blood count is low, this is not new for you however something to be aware about.

## 2019-11-29 NOTE — ED Triage Notes (Addendum)
Pain, swelling and bruising to her left lower leg. States she had a JJ shot 6 weeks ago.

## 2019-11-29 NOTE — ED Notes (Signed)
[place] at the bedside.  To complete orders once they are finished.

## 2019-11-29 NOTE — ED Provider Notes (Signed)
[facility] EMERGENCY DEPARTMENT Provider Note   CSN: [id number] Arrival date & time: 11/29/19  1054     History Chief Complaint  Patient presents with  . Leg Pain    Tammy Evans is a 49 y.o. female with pertinent past medical history of generalized anxiety disorder that presents the emergency department today for calf pain and swelling on her left leg that started a week ago.  Patient states that she also noticed one bruise to her left calf.  States that it hurts worse upon walking, states that the pain is a 8/10 when resting and a 9/10 when walking.  Has been taking ibuprofen which has been helping.  States that swelling is present throughout the day, however lessened today.  Has never had anything happen to her like this before.  States that she did get [employer] 6 weeks ago.  No personal or family history of DVT, coagulation disorder, cancer.  No recent travel, surgeries, chance of pregnancy.  Denies any tobacco use.  Denies any fevers, chills, numbness and tingling.  Denies pain elsewhere.  Denies any chest pain or shortness of breath.  States that she was in generally normal health before this.  States that she is pretty active.  Denies any history of diabetes.  Denies any URI-like symptoms.  HPI     Past Medical History:  Diagnosis Date  . Chronic insomnia   . Headache, common migraine   . Menopausal state     Patient Active Problem List   Diagnosis Date Noted  . Pre-diabetes 01/01/2018  . Major depression in remission (HCC) 12/27/2017  . Other neutropenia (HCC) 08/08/2016  . Genital herpes 08/08/2016  . GAD (generalized anxiety disorder) 11/10/2015  . Migraine without aura and responsive to treatment 01/13/2015  . Insomnia, persistent 01/13/2015  . Night sweats 01/13/2015    Past Surgical History:  Procedure Laterality Date  . CESAREAN SECTION    . culposcopy    . TUBAL LIGATION       OB History   No obstetric history on file.      Family History  Problem Relation Age of Onset  . Diabetes Father   . Heart disease Father   . Hypertension Father   . Drug abuse Sister   . Sickle cell trait Daughter     Social History   Tobacco Use  . Smoking status: Never Smoker  . Smokeless tobacco: Never Used  Vaping Use  . Vaping Use: Never used  Substance Use Topics  . Alcohol use: Yes    Alcohol/week: 0.0 standard drinks    Comment: occasionally  . Drug use: No    Home Medications Prior to Admission medications   Medication Sig Start Date End Date Taking? Authorizing Provider  cloNIDine (CATAPRES) 0.1 MG tablet Take 1 tablet (0.1 mg total) by mouth at bedtime. 12/27/17   Alba Cory, MD  norethindrone (AYGESTIN) 5 MG tablet Take 1 tablet (5 mg total) by mouth daily. 05/14/18   Khatri, Hina, PA-C  SUMAtriptan (IMITREX) 100 MG tablet Take 1 tablet (100 mg total) by mouth once for 1 dose. 12/27/17 12/27/17  Alba Cory, MD  topiramate (TOPAMAX) 25 MG tablet Take 1-4 tablets (25-100 mg total) by mouth every evening. 12/27/17   Alba Cory, MD  valACYclovir (VALTREX) 500 MG tablet TAKE 1 TABLET (500 MG TOTAL) BY MOUTH DAILY. TAKE 3 TIMES A DAY FOR OUTBREAKS 02/20/17   Alba Cory, MD    Allergies    Patient has  no known allergies.  Review of Systems   Review of Systems  Constitutional: Negative for chills, diaphoresis, fatigue and fever.  HENT: Negative for congestion, sore throat and trouble swallowing.   Eyes: Negative for pain and visual disturbance.  Respiratory: Negative for cough, shortness of breath and wheezing.   Cardiovascular: Negative for chest pain, palpitations and leg swelling.  Gastrointestinal: Negative for abdominal distention, abdominal pain, diarrhea, nausea and vomiting.  Genitourinary: Negative for difficulty urinating.  Musculoskeletal: Positive for arthralgias. Negative for back pain, neck pain and neck stiffness.  Skin: Negative for pallor.  Neurological: Negative for  dizziness, speech difficulty, weakness and headaches.  Psychiatric/Behavioral: Negative for confusion.    Physical Exam Updated Vital Signs BP (!) 143/88 (BP Location: Right Arm)   Pulse 71   Temp 98.3 F (36.8 C) (Oral)   Resp 18   Ht 5\' 6"  (1.676 m)   Wt 66.8 kg   SpO2 100%   BMI 23.77 kg/m   Physical Exam Constitutional:      General: She is not in acute distress.    Appearance: Normal appearance. She is not ill-appearing, toxic-appearing or diaphoretic.  HENT:     Head: Normocephalic and atraumatic.     Mouth/Throat:     Mouth: Mucous membranes are moist.     Pharynx: Oropharynx is clear.  Eyes:     General: No scleral icterus.    Extraocular Movements: Extraocular movements intact.     Pupils: Pupils are equal, round, and reactive to light.  Cardiovascular:     Rate and Rhythm: Normal rate and regular rhythm.     Pulses: Normal pulses.     Heart sounds: Normal heart sounds.  Pulmonary:     Effort: Pulmonary effort is normal. No respiratory distress.     Breath sounds: Normal breath sounds. No stridor. No wheezing, rhonchi or rales.  Chest:     Chest wall: No tenderness.  Abdominal:     General: Abdomen is flat. There is no distension.     Palpations: Abdomen is soft.     Tenderness: There is no abdominal tenderness. There is no guarding or rebound.  Musculoskeletal:        General: Tenderness present. No swelling. Normal range of motion.     Cervical back: Normal range of motion and neck supple. No rigidity.     Right lower leg: No edema.     Left lower leg: No edema.     Comments: Left leg: Tenderness to left lower extremity, specifically around calf.  Soft compartments, able to fully extend and bend leg with normal strength and sensation throughout.  Quarter sized ecchymosis noted to the medial aspect of calf, appears as if it is healing.  No ecchymosis noted elsewhere.  No swelling or edema noted.  No warmth or erythema.  PT DP pulses 2+. Right leg: Normal   Skin:    General: Skin is warm and dry.     Capillary Refill: Capillary refill takes less than 2 seconds.     Coloration: Skin is not pale.  Neurological:     General: No focal deficit present.     Mental Status: She is alert and oriented to person, place, and time.     Cranial Nerves: No cranial nerve deficit.     Sensory: No sensory deficit.     Motor: No weakness.     Gait: Gait normal.  Psychiatric:        Mood and Affect: Mood normal.  Behavior: Behavior normal.     ED Results / Procedures / Treatments   Labs (all labs ordered are listed, but only abnormal results are displayed) Labs Reviewed  CBC WITH DIFFERENTIAL/PLATELET - Abnormal; Notable for the following components:      Result Value   WBC 3.3 (*)    Platelets 143 (*)    All other components within normal limits  COMPREHENSIVE METABOLIC PANEL    EKG None  Radiology US Venous Img Lower Unilateral Left  Result Date: 11/29/2019 CLINICAL DATA:  Left swelling, bruising, pain EXAM: LEFT LOWER EXTREMITY VENOUS DOPPLER ULTRASOUND TECHNIQUE: Gray-scale sonography with compression, as well as color and duplex ultrasound, were performed to evaluate the deep venous system(s) from the level of the common femoral vein through the popliteal and proximal calf veins. COMPARISON:  None. FINDINGS: VENOUS Normal compressibility of the common femoral, superficial femoral, and popliteal veins, as well as the visualized calf veins. Visualized portions of profunda femoral vein and great saphenous vein unremarkable. No filling defects to suggest DVT on grayscale or color Doppler imaging. Doppler waveforms show normal direction of venous flow, normal respiratory phasicity and response to augmentation. Limited views of the contralateral common femoral vein are unremarkable. OTHER None. Limitations: none IMPRESSION: No femoropopliteal DVT nor evidence of DVT within the visualized calf veins. If clinical symptoms are inconsistent or if  there are persistent or worsening symptoms, further imaging (possibly involving the iliac veins) may be warranted. Electronically Signed   By: Corlis Leak  Hassell M.D.   On: 11/29/2019 12:16    Procedures Procedures (including critical care time)  Medications Ordered in ED Medications  acetaminophen (TYLENOL) tablet 650 mg (650 mg Oral Given 11/29/19 1216)    ED Course  I have reviewed the triage vital signs and the nursing notes.  Pertinent labs & imaging results that were available during my care of the patient were reviewed by me and considered in my medical decision making (see chart for details).    MDM Rules/Calculators/A&P                         Tammy PennerDeborah Yvette Ma is a 49 y.o. female with pertinent past medical history of generalized anxiety disorder that presents the emergency department today for calf pain and swelling on her left leg that started a week ago.  Physical exam with tenderness to left calf, no true edema noted on exam.  No hip pain.  Able to move hip, and left lower leg without any problems.  Compartment soft.  No signs of infection or cellulitis.  Distally neurovascularly intact.  Normal gait.  Doppler ultrasound without any signs for DVT.  CBC and CMP stable.  Thrombocytopenia and leukopenia appear to be around patient's baseline, did discuss this with patient and need for follow-up with PCP.  Patient agreeable at this time.  I think that patient could have calf pain due to muscle strain, did discuss this with patient since patient is pretty active with exercise.  Doubt need for further emergent work up at this time. I explained the diagnosis and have given explicit precautions to return to the ER including for any other new or worsening symptoms. The patient understands and accepts the medical plan as it's been dictated and I have answered their questions. Discharge instructions concerning home care and prescriptions have been given. The patient is STABLE and is discharged to  home in good condition.    Final Clinical Impression(s) / ED Diagnoses Final diagnoses:  Pain of left calf  Muscle strain    Rx / DC Orders ED Discharge Orders    None       Farrel Gordon, PA-C 11/29/19 1309    Raeford Razor, MD 12/03/19 1110

## 2019-12-06 DIAGNOSIS — M25562 Pain in left knee: Secondary | ICD-10-CM | POA: Insufficient documentation

## 2019-12-06 NOTE — Patient Instructions (Signed)

## 2019-12-06 NOTE — Progress Notes (Signed)
Name: Tammy PennerDeborah Yvette Emberton   MRN: [id number]    DOB: 1970/05/17   Date:12/09/2019       Progress Note  Subjective  Chief Complaint  Chief Complaint  Patient presents with   Knee Pain    She was evaluated by ortho about 8 years ago. 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   Menopause    She has hot flashes mostly at night and is unable to sleep.   Medication Refill     Hot Flashes She has been having hot flashes x 1-2 years. She is unable to sleep at night, we tried Clonidine in the past but did not work, she would like to try something for sleep instead. She works first shift now. Seems like she responded to Trazodone in the past but stopped medication on her own.   Migraines She continues to have migraines intermittently. Certain things like different smells and lack of sleep trigger her migraines. She is completely out of her migraine medication, does not want preventive medication since episodes down to once a month . She was taking Imitrex 100 mg  Low platelets and white count: recheck today, she states she seems to bruise easily, no blood in stools or gum bleeding. She agrees on seeing hematologist   Pre-diabetes: last A1C was over one year ago, it was 5.7 % she states she has noticed increase in appetite, thirsty and urinary frequency. 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 She states heaviest about 30 lbs, but has to bend to lift packages. [facility] because knee pain went to left leg, and caused calf and ankle swelling. She had a negative doppler US. She states knee pain is described as aching, worse with activity , especially at the end of a work week. It is better with rest, she has woken up twice with a very sharp pain on left  knee, no redness or increase in warmth. She has requested position change at work and took some time off to see if would help, she states pain is not as severe now, but still feels stiff when she gets up    Patient Active Problem List   Diagnosis Date Noted   Knee pain, left 12/06/2019   Pre-diabetes 01/01/2018   Major depression in remission (HCC) 12/27/2017   Other neutropenia (HCC) 08/08/2016   Genital herpes 08/08/2016   GAD (generalized anxiety disorder) 11/10/2015   Migraine without aura and responsive to treatment 01/13/2015   Insomnia, persistent 01/13/2015   Night sweats 01/13/2015    Past Surgical History:  Procedure Laterality Date   CESAREAN SECTION     culposcopy     TUBAL LIGATION      Family History  Problem Relation Age of Onset   Diabetes Father    Heart disease Father    Hypertension Father    Drug abuse Sister    Sickle cell trait Daughter     Social History   Tobacco Use   Smoking status: Never Smoker   Smokeless tobacco: Never Used  Substance Use Topics   Alcohol use: Yes    Alcohol/week: 0.0 standard drinks    Comment: occasionally     Current Outpatient Medications:  valACYclovir (VALTREX) 500 MG tablet, TAKE 1 TABLET (500 MG TOTAL) BY MOUTH DAILY. TAKE 3 TIMES A DAY FOR OUTBREAKS, Disp: 35 tablet, Rfl: 1   meloxicam (MOBIC) 15 MG tablet, Take 1 tablet (15 mg total) by mouth daily., Disp: 30 tablet, Rfl: 0   SUMAtriptan (IMITREX) 100 MG tablet, Take 1 tablet (100 mg total) by mouth once for 1 dose., Disp: 10 tablet, Rfl: 2   traZODone (DESYREL) 50 MG tablet, Take 0.5-1 tablets (25-50 mg total) by mouth at bedtime as needed for sleep., Disp: 30 tablet, Rfl: 0  No Known Allergies  I personally reviewed active problem list, medication list, allergies, family history, social history, health maintenance with the patient/caregiver today.   ROS  Constitutional: Negative for fever or weight change.  Respiratory:  Negative for cough and shortness of breath.   Cardiovascular: Negative for chest pain or palpitations.  Gastrointestinal: Negative for abdominal pain, no bowel changes.  Musculoskeletal:positive for gait problem and intermittent  joint swelling.  Skin: Negative for rash.  Neurological: Negative for dizziness or headache.  No other specific complaints in a complete review of systems (except as listed in HPI above).  Objective  Vitals:   12/09/19 1415  BP: 120/80  Pulse: (!) 106  Resp: 16  Temp: 97.8 F (36.6 C)  TempSrc: Oral  SpO2: 99%  Weight: 147 lb 1.6 oz (66.7 kg)  Height: 5\' 6"  (1.676 m)    Body mass index is 23.74 kg/m.  Physical Exam  Constitutional: Patient appears well-developed and well-nourished. No distress.  HEENT: head atraumatic, normocephalic, pupils equal and reactive to light,neck supple Cardiovascular: Normal rate, regular rhythm and normal heart sounds.  No murmur heard. No BLE edema. Pulmonary/Chest: Effort normal and breath sounds normal. No respiratory distress. Abdominal: Soft.  There is no tenderness. Muscular skeletal: crepitus with extension of both knees, no effusion, no calf tenderness  Psychiatric: Patient has a normal mood and affect. behavior is normal. Judgment and thought content normal.  Recent Results (from the past 2160 hour(s))  Comprehensive metabolic panel     Status: None   Collection Time: 11/29/19 12:17 PM  Result Value Ref Range   Sodium 135 135 - 145 mmol/L   Potassium 4.2 3.5 - 5.1 mmol/L   Chloride 100 98 - 111 mmol/L   CO2 27 22 - 32 mmol/L   Glucose, Bld 83 70 - 99 mg/dL    Comment: Glucose reference range applies only to samples taken after fasting for at least 8 hours.   BUN 18 6 - 20 mg/dL   Creatinine, Ser 12/01/19 0.44 - 1.00 mg/dL   Calcium 9.0 8.9 - 9.50 mg/dL   Total Protein 7.2 6.5 - 8.1 g/dL   Albumin 4.2 3.5 - 5.0 g/dL   AST 22 15 - 41 U/L   ALT 22 0 - 44 U/L   Alkaline Phosphatase 79 38 - 126 U/L   Total  Bilirubin 1.2 0.3 - 1.2 mg/dL   GFR calc non Af Amer >60 >60 mL/min   GFR calc Af Amer >60 >60 mL/min   Anion gap 8 5 - 15    Comment: Performed at [facility], [address] [place], [place]  CBC with Differential     Status: Abnormal   Collection Time: 11/29/19 12:17 PM  Result Value Ref Range   WBC 3.3 (L) 4.0 - 10.5 K/uL   RBC 4.21 3.87 - 5.11 MIL/uL   Hemoglobin 12.7 12.0 - 15.0 g/dL  HCT 38.5 36 - 46 %   MCV 91.4 80.0 - 100.0 fL   MCH 30.2 26.0 - 34.0 pg   MCHC 33.0 30.0 - 36.0 g/dL   RDW 54.0 08.6 - 76.1 %   Platelets 143 (L) 150 - 400 K/uL   nRBC 0.0 0.0 - 0.2 %   Neutrophils Relative % 59 %   Neutro Abs 1.9 1.7 - 7.7 K/uL   Lymphocytes Relative 26 %   Lymphs Abs 0.9 0.7 - 4.0 K/uL   Monocytes Relative 13 %   Monocytes Absolute 0.4 0 - 1 K/uL   Eosinophils Relative 2 %   Eosinophils Absolute 0.1 0 - 0 K/uL   Basophils Relative 0 %   Basophils Absolute 0.0 0 - 0 K/uL   Immature Granulocytes 0 %   Abs Immature Granulocytes 0.00 0.00 - 0.07 K/uL    Comment: Performed at [facility], [address] [place], [place] [address]     PHQ2/9: Depression screen [facility] 2/9 12/09/2019 12/27/2017 12/27/2017 02/20/2017 08/08/2016  Decreased Interest 0 0 0 0 0  Down, Depressed, Hopeless 0 0 0 0 0  PHQ - 2 Score 0 0 0 0 0  Altered sleeping 2 3 - - -  Tired, decreased energy 1 2 - - -  Change in appetite 0 0 - - -  Feeling bad or failure about yourself  0 0 - - -  Trouble concentrating 0 0 - - -  Moving slowly or fidgety/restless 0 0 - - -  Suicidal thoughts 0 0 - - -  PHQ-9 Score 3 5 - - -  Difficult doing work/chores Somewhat difficult Not difficult at all - - -    phq 9 is negative   Fall Risk: Fall Risk  12/09/2019 12/27/2017 02/20/2017 08/08/2016 01/05/2016  Falls in the past year? 0 No No No No  Number falls in past yr: 0 - - - -  Injury with Fall? 0 - - - -     Functional Status Survey: Is the patient deaf or have difficulty  hearing?: No Does the patient have difficulty seeing, even when wearing glasses/contacts?: No Does the patient have difficulty concentrating, remembering, or making decisions?: No Does the patient have difficulty walking or climbing stairs?: No Does the patient have difficulty dressing or bathing?: No Does the patient have difficulty doing errands alone such as visiting a doctor's office or shopping?: No    Assessment & Plan  1. Acute pain of left knee  - meloxicam (MOBIC) 15 MG tablet; Take 1 tablet (15 mg total) by mouth daily.  Dispense: 30 tablet; Refill: 0 - Ambulatory referral to Sports Medicine  2. Thrombocytopenia (HCC)  - CBC with Differential/Platelet  3. Migraine without aura and responsive to treatment  - SUMAtriptan (IMITREX) 100 MG tablet; Take 1 tablet (100 mg total) by mouth once for 1 dose.  Dispense: 10 tablet; Refill: 2  4. Other neutropenia [facility])  Referral hematologist   5. Hot flashes  - TSH  6. Need for Tdap vaccination  - Tdap vaccine greater than or equal to 7yo IM  7. Colon cancer screening  cologuard   8. Post-menopausal   9. Breast cancer screening by mammogram  Mammogram  10. Pre-diabetes  - COMPLETE METABOLIC PANEL WITH GFR - Hemoglobin A1c  11. Lipid screening  - Lipid panel  12. Other fatigue  - COMPLETE METABOLIC PANEL WITH GFR - TSH - Vitamin B12 - VITAMIN D 25 Hydroxy (Vit-D Deficiency, Fractures)  13. Need for hepatitis C screening test  - Hepatitis C antibody  14. Genital herpes simplex, unspecified site  - valACYclovir (VALTREX) 500 MG tablet; TAKE 1 TABLET (500 MG TOTAL) BY MOUTH DAILY. TAKE 3 TIMES A DAY FOR OUTBREAKS  Dispense: 35 tablet; Refill: 1  15. Insomnia, unspecified type  - traZODone (DESYREL) 50 MG tablet; Take 0.5-1 tablets (25-50 mg total) by mouth at bedtime as needed for sleep.  Dispense: 30 tablet; Refill: 0

## 2019-12-09 ENCOUNTER — Ambulatory Visit: Payer: Federal, State, Local not specified - PPO | Admitting: Family Medicine

## 2019-12-09 ENCOUNTER — Other Ambulatory Visit: Payer: Self-pay | Admitting: Family Medicine

## 2019-12-09 ENCOUNTER — Encounter: Payer: Self-pay | Admitting: Family Medicine

## 2019-12-09 ENCOUNTER — Other Ambulatory Visit: Payer: Self-pay

## 2019-12-09 VITALS — BP 120/80 | HR 106 | Temp 97.8°F | Resp 16 | Ht 66.0 in | Wt 147.1 lb

## 2019-12-09 DIAGNOSIS — Z1231 Encounter for screening mammogram for malignant neoplasm of breast: Secondary | ICD-10-CM

## 2019-12-09 DIAGNOSIS — A6000 Herpesviral infection of urogenital system, unspecified: Secondary | ICD-10-CM

## 2019-12-09 DIAGNOSIS — R232 Flushing: Secondary | ICD-10-CM | POA: Diagnosis not present

## 2019-12-09 DIAGNOSIS — R5383 Other fatigue: Secondary | ICD-10-CM

## 2019-12-09 DIAGNOSIS — G43009 Migraine without aura, not intractable, without status migrainosus: Secondary | ICD-10-CM

## 2019-12-09 DIAGNOSIS — Z13220 Encounter for screening for lipoid disorders: Secondary | ICD-10-CM

## 2019-12-09 DIAGNOSIS — Z1159 Encounter for screening for other viral diseases: Secondary | ICD-10-CM

## 2019-12-09 DIAGNOSIS — M25562 Pain in left knee: Secondary | ICD-10-CM

## 2019-12-09 DIAGNOSIS — Z780 Asymptomatic menopausal state: Secondary | ICD-10-CM

## 2019-12-09 DIAGNOSIS — D708 Other neutropenia: Secondary | ICD-10-CM

## 2019-12-09 DIAGNOSIS — D696 Thrombocytopenia, unspecified: Secondary | ICD-10-CM | POA: Diagnosis not present

## 2019-12-09 DIAGNOSIS — R7303 Prediabetes: Secondary | ICD-10-CM

## 2019-12-09 DIAGNOSIS — Z23 Encounter for immunization: Secondary | ICD-10-CM | POA: Diagnosis not present

## 2019-12-09 DIAGNOSIS — G4700 Insomnia, unspecified: Secondary | ICD-10-CM

## 2019-12-09 DIAGNOSIS — Z1211 Encounter for screening for malignant neoplasm of colon: Secondary | ICD-10-CM

## 2019-12-09 MED ORDER — TRAZODONE HCL 50 MG PO TABS: 25.0000 mg | ORAL_TABLET | Freq: Every evening | ORAL | 0 refills | Status: DC | PRN

## 2019-12-09 MED ORDER — SUMATRIPTAN SUCCINATE 100 MG PO TABS: 100.0000 mg | ORAL_TABLET | Freq: Once | ORAL | 2 refills | Status: DC

## 2019-12-09 MED ORDER — MELOXICAM 15 MG PO TABS: 15.0000 mg | ORAL_TABLET | Freq: Every day | ORAL | 0 refills | Status: DC

## 2019-12-09 MED ORDER — VALACYCLOVIR HCL 500 MG PO TABS: ORAL_TABLET | ORAL | 1 refills | Status: DC

## 2019-12-10 LAB — COMPLETE METABOLIC PANEL WITH GFR
AG Ratio: 1.8 (calc) (ref 1.0–2.5)
ALT: 16 U/L (ref 6–29)
AST: 20 U/L (ref 10–35)
Albumin: 4.5 g/dL (ref 3.6–5.1)
Alkaline phosphatase (APISO): 91 U/L (ref 37–153)
BUN: 14 mg/dL (ref 7–25)
CO2: 30 mmol/L (ref 20–32)
Calcium: 9.4 mg/dL (ref 8.6–10.4)
Chloride: 103 mmol/L (ref 98–110)
Creat: 1 mg/dL (ref 0.50–1.05)
GFR, Est African American: 76 mL/min/{1.73_m2} (ref >=60)
GFR, Est Non African American: 65 mL/min/{1.73_m2} (ref >=60)
Globulin: 2.5 g/dL (calc) (ref 1.9–3.7)
Glucose, Bld: 84 mg/dL (ref 65–99)
Potassium: 4.3 mmol/L (ref 3.5–5.3)
Sodium: 139 mmol/L (ref 135–146)
Total Bilirubin: 1.3 mg/dL — ABNORMAL HIGH (ref 0.2–1.2)
Total Protein: 7 g/dL (ref 6.1–8.1)

## 2019-12-10 LAB — CBC WITH DIFFERENTIAL/PLATELET
Absolute Monocytes: 324 cells/uL (ref 200–950)
Basophils Absolute: 20 cells/uL (ref 0–200)
Basophils Relative: 0.5 %
Eosinophils Absolute: 70 cells/uL (ref 15–500)
Eosinophils Relative: 1.8 %
HCT: 38.9 % (ref 35.0–45.0)
Hemoglobin: 12.9 g/dL (ref 11.7–15.5)
Lymphs Abs: 909 cells/uL (ref 850–3900)
MCH: 29.9 pg (ref 27.0–33.0)
MCHC: 33.2 g/dL (ref 32.0–36.0)
MCV: 90.3 fL (ref 80.0–100.0)
MPV: 11.6 fL (ref 7.5–12.5)
Monocytes Relative: 8.3 %
Neutro Abs: 2578 cells/uL (ref 1500–7800)
Neutrophils Relative %: 66.1 %
Platelets: 145 10*3/uL (ref 140–400)
RBC: 4.31 10*6/uL (ref 3.80–5.10)
RDW: 13.3 % (ref 11.0–15.0)
Total Lymphocyte: 23.3 %
WBC: 3.9 10*3/uL (ref 3.8–10.8)

## 2019-12-10 LAB — HEMOGLOBIN A1C
Hgb A1c MFr Bld: 5.7 % of total Hgb — ABNORMAL HIGH (ref <5.7)
Mean Plasma Glucose: 117 (calc)
eAG (mmol/L): 6.5 (calc)

## 2019-12-10 LAB — LIPID PANEL
Cholesterol: 193 mg/dL (ref <200)
HDL: 70 mg/dL (ref >=50)
LDL Cholesterol (Calc): 100 mg/dL (calc) — ABNORMAL HIGH
Non-HDL Cholesterol (Calc): 123 mg/dL (calc) (ref <130)
Total CHOL/HDL Ratio: 2.8 (calc) (ref <5.0)
Triglycerides: 129 mg/dL (ref <150)

## 2019-12-10 LAB — HEPATITIS C ANTIBODY
Hepatitis C Ab: NONREACTIVE
SIGNAL TO CUT-OFF: 0.02 (ref <1.00)

## 2019-12-10 LAB — TSH: TSH: 0.6 mIU/L

## 2019-12-10 LAB — VITAMIN B12: Vitamin B-12: 519 pg/mL (ref 200–1100)

## 2019-12-10 LAB — VITAMIN D 25 HYDROXY (VIT D DEFICIENCY, FRACTURES): Vit D, 25-Hydroxy: 14 ng/mL — ABNORMAL LOW (ref 30–100)

## 2019-12-17 ENCOUNTER — Encounter: Payer: Self-pay | Admitting: Family Medicine

## 2019-12-17 ENCOUNTER — Other Ambulatory Visit: Payer: Self-pay | Admitting: Family Medicine

## 2019-12-17 ENCOUNTER — Ambulatory Visit: Payer: Self-pay

## 2019-12-17 ENCOUNTER — Ambulatory Visit (INDEPENDENT_AMBULATORY_CARE_PROVIDER_SITE_OTHER): Payer: Federal, State, Local not specified - PPO

## 2019-12-17 ENCOUNTER — Ambulatory Visit: Payer: Federal, State, Local not specified - PPO | Admitting: Family Medicine

## 2019-12-17 ENCOUNTER — Other Ambulatory Visit: Payer: Self-pay

## 2019-12-17 VITALS — BP 110/78 | HR 75 | Ht 66.0 in | Wt 150.2 lb

## 2019-12-17 DIAGNOSIS — M25561 Pain in right knee: Secondary | ICD-10-CM | POA: Diagnosis not present

## 2019-12-17 DIAGNOSIS — M25562 Pain in left knee: Secondary | ICD-10-CM | POA: Diagnosis not present

## 2019-12-17 DIAGNOSIS — E559 Vitamin D deficiency, unspecified: Secondary | ICD-10-CM

## 2019-12-17 MED ORDER — VITAMIN D (ERGOCALCIFEROL) 1.25 MG (50000 UNIT) PO CAPS: 50000.0000 [IU] | ORAL_CAPSULE | ORAL | 1 refills | Status: DC

## 2019-12-17 NOTE — Progress Notes (Signed)
Subjective:    I'm seeing this patient as a consultation for:  Dr. Carlynn Purl. Note will be routed back to referring provider/PCP.  CC: L knee/calf pain  I, Molly Weber, LAT, ATC, am serving as scribe for Dr. Clementeen Graham.  HPI: Pt is a 49 y/o female presenting w/ c/o L knee/calf pain x approximately 2 weeks.  She was seen at the [facility] ED on 11/29/19 w/ c/o L calf pain and swelling and then seen for f/u w/ her PCP on 12/09/19.  She locates her pain to her L ant knee and L proximal calf.  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  L knee swelling: No L knee mechanical symptoms: No Aggravating factors: prolonged standing and walking particularly on hard, concrete floors; getting out of bed in the morning (knee feels stiff) Treatments tried: IBU; ice and elevation  Past medical history, Surgical history, Family history, Social history, Allergies, and medications have been entered into the medical record, reviewed.   Review of Systems: No new headache, visual changes, nausea, vomiting, diarrhea, constipation, dizziness, abdominal pain, skin rash, fevers, chills, night sweats, weight loss, swollen lymph nodes, body aches, joint swelling, muscle aches, chest pain, shortness of breath, mood changes, visual or auditory hallucinations.   Objective:    Vitals:   12/17/19 1602  BP: 110/78  Pulse: 75  SpO2: 97%   General: Well Developed, well nourished, and in no acute distress.  Neuro/Psych: Alert and oriented x3, extra-ocular muscles intact, able to move all 4 extremities, sensation grossly intact. Skin: Warm and dry, no rashes noted.  Respiratory: Not using accessory muscles, speaking in full sentences, trachea midline.  Cardiovascular: Pulses palpable, no extremity edema. Abdomen: Does not appear distended. MSK: Left knee normal-appearing without effusion. Normal motion with  mild crepitation. Tender palpation medial joint line. Stable ligamentous exam. Intact strength.  Left calf normal-appearing nontender no palpable cords.  Lab and Radiology Results X-ray images left knee obtained today personally and independently reviewed Mild degenerative changes no acute fractures. Await formal radiology review  Diagnostic Limited MSK Ultrasound of: Left knee Quad tendon intact normal-appearing. Trace joint effusion superior patellar space. Patellar tendon normal. Medial joint line narrowed with degenerative appearing medial meniscus. Lateral joint line slightly narrowed otherwise normal-appearing Posterior knee no Baker's cyst. Impression: Medial DJD  Procedure: Real-time Ultrasound Guided Injection of left knee lateral superior patellar space Device: Philips Affiniti 50G Images permanently stored and available for review in PACS Verbal informed consent obtained.  Discussed risks and benefits of procedure. Warned about infection bleeding damage to structures skin hypopigmentation and fat atrophy among others. Patient expresses understanding and agreement Time-out conducted.   Noted no overlying erythema, induration, or other signs of local infection.   Skin prepped in a sterile fashion.   Local anesthesia: Topical Ethyl chloride.   With sterile technique and under real time ultrasound guidance:  40 mg of Kenalog and 2 mL of Marcaine injected easily.   Completed without difficulty   Pain immediately resolved suggesting accurate placement of the medication.   Advised to call if fevers/chills, erythema, induration, drainage, or persistent bleeding.   Images permanently stored and available for review in the ultrasound unit.  Impression: Technically successful ultrasound guided injection.        Impression and Recommendations:    Assessment and Plan: 49 y.o. female with left knee pain.  Her  history of knee pain and the Swelling is concerning for ruptured  Baker's cyst however she does not have a Baker's cyst on ultrasound today and based on report of vascular ultrasound probably did not have a Baker's cyst on August 20 when she was seen in the ED.  Main finding today is DJD.  Plan for steroid injection Voltaren gel and compressive knee sleeve.  If not improving would consider MRI.  PDMP not reviewed this encounter. Orders Placed This Encounter  Procedures  . [place] LIMITED JOINT SPACE STRUCTURES LOW RIGHT(NO LINKED CHARGES)    Order Specific Question:   Reason for Exam (SYMPTOM  OR DIAGNOSIS REQUIRED)    Answer:   R knee pain    Order Specific Question:   Preferred imaging location?    Answer:   Adult [facility]  . DG Knee AP/LAT W/Sunrise Left    Standing Status:   Future    Number of Occurrences:   1    Standing Expiration Date:   12/16/2020    Order Specific Question:   Reason for Exam (SYMPTOM  OR DIAGNOSIS REQUIRED)    Answer:   eval knee    Order Specific Question:   Is patient pregnant?    Answer:   No    Order Specific Question:   Preferred imaging location?    Answer:   [facility]    Order Specific Question:   Radiology Contrast Protocol - do NOT remove file path    Answer:   [email or web address]   No orders of the defined types were placed in this encounter.   Discussed warning signs or symptoms. Please see discharge instructions. Patient expresses understanding.   The above documentation has been reviewed and is accurate and complete Clementeen Graham, M.D.

## 2019-12-17 NOTE — Patient Instructions (Signed)
Thank you for coming in today. Use the voltaren gel on the knee as needed.  Use meloxicam as needed 1x daily.  Ok to take with tylenol.  Use compression sleeve on the knee during activity.   I recommend you obtained a compression sleeve to help with your joint problems. There are many options on the market however I recommend obtaining a Full knee Body Helix compression sleeve.  You can find information (including how to appropriate measure yourself for sizing) can be found at www.Body [employer].ch.  Many of these products are health savings account (HSA) eligible.   You can use the compression sleeve at any time throughout the day but is most important to use while being active as well as for 2 hours post-activity.   It is appropriate to ice following activity with the compression sleeve in place.   Get xray today.   Let me  Know if you are not improving.

## 2019-12-18 NOTE — Progress Notes (Signed)
X-rays left knee show some mild bone spurring otherwise looks pretty normal to radiology.

## 2019-12-20 DIAGNOSIS — Z01419 Encounter for gynecological examination (general) (routine) without abnormal findings: Secondary | ICD-10-CM | POA: Diagnosis not present

## 2019-12-20 DIAGNOSIS — Z6824 Body mass index (BMI) 24.0-24.9, adult: Secondary | ICD-10-CM | POA: Diagnosis not present

## 2019-12-20 DIAGNOSIS — Z124 Encounter for screening for malignant neoplasm of cervix: Secondary | ICD-10-CM | POA: Diagnosis not present

## 2019-12-20 DIAGNOSIS — Z1231 Encounter for screening mammogram for malignant neoplasm of breast: Secondary | ICD-10-CM | POA: Diagnosis not present

## 2020-01-03 ENCOUNTER — Ambulatory Visit
Admission: RE | Admit: 2020-01-03 | Discharge: 2020-01-03 | Disposition: A | Discharge status: Home / Self Care | Payer: Federal, State, Local not specified - PPO | Source: Ambulatory Visit | Attending: Family Medicine | Admitting: Family Medicine

## 2020-01-03 ENCOUNTER — Other Ambulatory Visit: Payer: Self-pay

## 2020-01-03 DIAGNOSIS — Z1231 Encounter for screening mammogram for malignant neoplasm of breast: Secondary | ICD-10-CM | POA: Diagnosis not present

## 2020-01-15 ENCOUNTER — Telehealth: Payer: Self-pay

## 2020-01-15 DIAGNOSIS — Z1211 Encounter for screening for malignant neoplasm of colon: Secondary | ICD-10-CM

## 2020-01-15 NOTE — Telephone Encounter (Signed)
Copied from CRM [id number]. Topic: Referral - Request for Referral >> Jan 14, 2020  5:04 PM Adrian Prince D wrote: Has patient seen PCP for this complaint? Yes.   *If NO, is insurance requiring patient see PCP for this issue before PCP can refer them? Referral for which specialty: Colonoscopy Preferred provider/office: unknown Reason for referral: patient received a call that she needed to schedule an appointment, she would like to be called after 3:30, she can't take calls at work.

## 2020-01-16 NOTE — Telephone Encounter (Signed)
Pt has an appt on 01-30-2020 for cpe

## 2020-01-29 ENCOUNTER — Telehealth (INDEPENDENT_AMBULATORY_CARE_PROVIDER_SITE_OTHER): Payer: Self-pay | Admitting: Gastroenterology

## 2020-01-29 ENCOUNTER — Other Ambulatory Visit: Payer: Self-pay

## 2020-01-29 DIAGNOSIS — Z8371 Family history of colonic polyps: Secondary | ICD-10-CM

## 2020-01-29 DIAGNOSIS — Z1211 Encounter for screening for malignant neoplasm of colon: Secondary | ICD-10-CM

## 2020-01-29 MED ORDER — GOLYTELY 236 G PO SOLR: 4000.0000 mL | Freq: Once | ORAL | 0 refills | Status: AC

## 2020-01-29 NOTE — Progress Notes (Signed)
Gastroenterology Pre-Procedure Review  Request Date: 02/28/20 Requesting Physician: Dr. Servando Snare  PATIENT REVIEW QUESTIONS: The patient responded to the following health history questions as indicated:    1. Are you having any GI issues? no 2. Do you have a personal history of Polyps? no 3. Do you have a family history of Colon Cancer or Polyps? yes (sister and dad had colon polyps) 4. Diabetes Mellitus? no 5. Joint replacements in the past 12 months?no 6. Major health problems in the past 3 months?no 7. Any artificial heart valves, MVP, or defibrillator?no    MEDICATIONS & ALLERGIES:    Patient reports the following regarding taking any anticoagulation/antiplatelet therapy:   Plavix, Coumadin, Eliquis, Xarelto, Lovenox, Pradaxa, Brilinta, or Effient? no Aspirin? no  Patient confirms/reports the following medications:  Current Outpatient Medications  Medication Sig Dispense Refill  . meloxicam (MOBIC) 15 MG tablet Take 1 tablet (15 mg total) by mouth daily. 30 tablet 0  . traZODone (DESYREL) 50 MG tablet Take 0.5-1 tablets (25-50 mg total) by mouth at bedtime as needed for sleep. 30 tablet 0  . valACYclovir (VALTREX) 500 MG tablet TAKE 1 TABLET (500 MG TOTAL) BY MOUTH DAILY. TAKE 3 TIMES A DAY FOR OUTBREAKS 35 tablet 1  . Vitamin D, Ergocalciferol, (DRISDOL) 1.25 MG (50000 UNIT) CAPS capsule Take 1 capsule (50,000 Units total) by mouth every 7 (seven) days. 12 capsule 1  . polyethylene glycol (GOLYTELY) 236 g solution Take 4,000 mLs by mouth once for 1 dose. 4000 mL 0  . SUMAtriptan (IMITREX) 100 MG tablet Take 1 tablet (100 mg total) by mouth once for 1 dose. 10 tablet 2   No current facility-administered medications for this visit.    Patient confirms/reports the following allergies:  No Known Allergies  No orders of the defined types were placed in this encounter.   AUTHORIZATION INFORMATION Primary Insurance: 1D#: Group #:  Secondary Insurance: 1D#: Group #:  SCHEDULE  INFORMATION: Date: 02/28/20 Time: Location:[facility]

## 2020-01-30 ENCOUNTER — Encounter: Payer: Federal, State, Local not specified - PPO | Admitting: Family Medicine

## 2020-02-06 ENCOUNTER — Encounter: Payer: Federal, State, Local not specified - PPO | Admitting: Family Medicine

## 2020-02-20 ENCOUNTER — Encounter: Payer: Self-pay | Admitting: Gastroenterology

## 2020-02-20 ENCOUNTER — Other Ambulatory Visit: Payer: Self-pay

## 2020-02-26 ENCOUNTER — Other Ambulatory Visit
Admission: RE | Admit: 2020-02-26 | Discharge: 2020-02-26 | Disposition: A | Discharge status: Home / Self Care | Payer: Federal, State, Local not specified - PPO | Source: Ambulatory Visit | Attending: Gastroenterology | Admitting: Gastroenterology

## 2020-02-26 ENCOUNTER — Other Ambulatory Visit: Payer: Self-pay

## 2020-02-26 DIAGNOSIS — Z20822 Contact with and (suspected) exposure to covid-19: Secondary | ICD-10-CM | POA: Insufficient documentation

## 2020-02-26 DIAGNOSIS — Z01812 Encounter for preprocedural laboratory examination: Secondary | ICD-10-CM | POA: Insufficient documentation

## 2020-02-26 DIAGNOSIS — Z791 Long term (current) use of non-steroidal anti-inflammatories (NSAID): Secondary | ICD-10-CM | POA: Diagnosis not present

## 2020-02-26 DIAGNOSIS — K648 Other hemorrhoids: Secondary | ICD-10-CM | POA: Diagnosis not present

## 2020-02-26 DIAGNOSIS — Z1211 Encounter for screening for malignant neoplasm of colon: Secondary | ICD-10-CM | POA: Diagnosis not present

## 2020-02-26 LAB — SARS CORONAVIRUS 2 (TAT 6-24 HRS): SARS Coronavirus 2: NEGATIVE

## 2020-02-27 NOTE — Discharge Instructions (Signed)
General Anesthesia, Adult, Care After This sheet gives you information about how to care for yourself after your procedure. Your health care provider may also give you more specific instructions. If you have problems or questions, contact your health care provider. What can I expect after the procedure? After the procedure, the following side effects are common:  Pain or discomfort at the IV site.  Nausea.  Vomiting.  Sore throat.  Trouble concentrating.  Feeling cold or chills.  Weak or tired.  Sleepiness and fatigue.  Soreness and body aches. These side effects can affect parts of the body that were not involved in surgery. Follow these instructions at home:  For at least 24 hours after the procedure:  Have a responsible adult stay with you. It is important to have someone help care for you until you are awake and alert.  Rest as needed.  Do not: ? Participate in activities in which you could fall or become injured. ? Drive. ? Use heavy machinery. ? Drink alcohol. ? Take sleeping pills or medicines that cause drowsiness. ? Make important decisions or sign legal documents. ? Take care of children on your own. Eating and drinking  Follow any instructions from your health care provider about eating or drinking restrictions.  When you feel hungry, start by eating small amounts of foods that are soft and easy to digest (bland), such as toast. Gradually return to your regular diet.  Drink enough fluid to keep your urine pale yellow.  If you vomit, rehydrate by drinking water, juice, or clear broth. General instructions  If you have sleep apnea, surgery and certain medicines can increase your risk for breathing problems. Follow instructions from your health care provider about wearing your sleep device: ? Anytime you are sleeping, including during daytime naps. ? While taking prescription pain medicines, sleeping medicines, or medicines that make you drowsy.  Return to  your normal activities as told by your health care provider. Ask your health care provider what activities are safe for you.  Take over-the-counter and prescription medicines only as told by your health care provider.  If you smoke, do not smoke without supervision.  Keep all follow-up visits as told by your health care provider. This is important. Contact a health care provider if:  You have nausea or vomiting that does not get better with medicine.  You cannot eat or drink without vomiting.  You have pain that does not get better with medicine.  You are unable to pass urine.  You develop a skin rash.  You have a fever.  You have redness around your IV site that gets worse. Get help right away if:  You have difficulty breathing.  You have chest pain.  You have blood in your urine or stool, or you vomit blood. Summary  After the procedure, it is common to have a sore throat or nausea. It is also common to feel tired.  Have a responsible adult stay with you for the first 24 hours after general anesthesia. It is important to have someone help care for you until you are awake and alert.  When you feel hungry, start by eating small amounts of foods that are soft and easy to digest (bland), such as toast. Gradually return to your regular diet.  Drink enough fluid to keep your urine pale yellow.  Return to your normal activities as told by your health care provider. Ask your health care provider what activities are safe for you. This information is not   intended to replace advice given to you by your health care provider. Make sure you discuss any questions you have with your health care provider. Document Revised: 03/31/2017 Document Reviewed: 11/11/2016 Elsevier Patient Education  2020 Elsevier Inc.  

## 2020-02-28 ENCOUNTER — Encounter
Admission: RE | Disposition: A | Discharge status: Home / Self Care | Payer: Self-pay | Source: Home / Self Care | Attending: Gastroenterology

## 2020-02-28 ENCOUNTER — Ambulatory Visit
Admission: RE | Admit: 2020-02-28 | Discharge: 2020-02-28 | Disposition: A | Discharge status: Home / Self Care | Payer: Federal, State, Local not specified - PPO | Attending: Gastroenterology | Admitting: Gastroenterology

## 2020-02-28 ENCOUNTER — Ambulatory Visit: Payer: Federal, State, Local not specified - PPO | Admitting: Anesthesiology

## 2020-02-28 ENCOUNTER — Encounter: Payer: Self-pay | Admitting: Gastroenterology

## 2020-02-28 ENCOUNTER — Other Ambulatory Visit: Payer: Self-pay

## 2020-02-28 DIAGNOSIS — Z791 Long term (current) use of non-steroidal anti-inflammatories (NSAID): Secondary | ICD-10-CM | POA: Insufficient documentation

## 2020-02-28 DIAGNOSIS — Z20822 Contact with and (suspected) exposure to covid-19: Secondary | ICD-10-CM | POA: Diagnosis not present

## 2020-02-28 DIAGNOSIS — K648 Other hemorrhoids: Secondary | ICD-10-CM | POA: Diagnosis not present

## 2020-02-28 DIAGNOSIS — Z1211 Encounter for screening for malignant neoplasm of colon: Secondary | ICD-10-CM | POA: Diagnosis not present

## 2020-02-28 DIAGNOSIS — Z8371 Family history of colonic polyps: Secondary | ICD-10-CM

## 2020-02-28 HISTORY — PX: COLONOSCOPY WITH PROPOFOL: SHX5780

## 2020-02-28 LAB — POCT PREGNANCY, URINE: Preg Test, Ur: NEGATIVE

## 2020-02-28 SURGERY — COLONOSCOPY WITH PROPOFOL
Anesthesia: General | Site: Rectum

## 2020-02-28 MED ORDER — PROPOFOL 10 MG/ML IV BOLUS
INTRAVENOUS | Status: DC | PRN
  Administered 2020-02-28: 40 mg via INTRAVENOUS
  Administered 2020-02-28: 20 mg via INTRAVENOUS
  Administered 2020-02-28 (×2): 40 mg via INTRAVENOUS
  Administered 2020-02-28: 150 mg via INTRAVENOUS
  Administered 2020-02-28 (×3): 20 mg via INTRAVENOUS

## 2020-02-28 MED ORDER — LACTATED RINGERS IV SOLN: INTRAVENOUS | Status: DC

## 2020-02-28 MED ORDER — OXYCODONE HCL 5 MG PO TABS: 5.0000 mg | ORAL_TABLET | Freq: Once | ORAL | Status: DC | PRN

## 2020-02-28 MED ORDER — OXYCODONE HCL 5 MG/5ML PO SOLN: 5.0000 mg | Freq: Once | ORAL | Status: DC | PRN

## 2020-02-28 MED ORDER — SODIUM CHLORIDE 0.9 % IV SOLN: INTRAVENOUS | Status: DC

## 2020-02-28 MED ORDER — STERILE WATER FOR IRRIGATION IR SOLN: Status: DC | PRN

## 2020-02-28 MED ORDER — LIDOCAINE HCL (CARDIAC) PF 100 MG/5ML IV SOSY
PREFILLED_SYRINGE | INTRAVENOUS | Status: DC | PRN
  Administered 2020-02-28: 30 mg via INTRAVENOUS

## 2020-02-28 SURGICAL SUPPLY — 6 items
GOWN CVR UNV OPN BCK APRN NK (MISCELLANEOUS) ×2 IMPLANT
GOWN ISOL THUMB LOOP REG UNIV (MISCELLANEOUS) ×4
KIT PRC NS LF DISP ENDO (KITS) ×1 IMPLANT
KIT PROCEDURE OLYMPUS (KITS) ×2
MANIFOLD NEPTUNE II (INSTRUMENTS) ×2 IMPLANT
WATER STERILE IRR 250ML POUR (IV SOLUTION) ×2 IMPLANT

## 2020-02-28 NOTE — Anesthesia Procedure Notes (Signed)
Date/Time: 02/28/2020 10:04 AM Performed by: Maree Krabbe, CRNA Pre-anesthesia Checklist: Patient identified, Emergency Drugs available, Suction available, Timeout performed and Patient being monitored Patient Re-evaluated:Patient Re-evaluated prior to induction Oxygen Delivery Method: Nasal cannula Placement Confirmation: positive ETCO2

## 2020-02-28 NOTE — Anesthesia Postprocedure Evaluation (Signed)
Anesthesia Post Note  Patient: Tammy Evans  Procedure(s) Performed: COLONOSCOPY WITH PROPOFOL (N/A Rectum)     Patient location during evaluation: PACU Anesthesia Type: General Level of consciousness: awake and alert Pain management: pain level controlled Vital Signs Assessment: post-procedure vital signs reviewed and stable Respiratory status: spontaneous breathing, nonlabored ventilation, respiratory function stable and patient connected to nasal cannula oxygen Cardiovascular status: blood pressure returned to baseline and stable Postop Assessment: no apparent nausea or vomiting Anesthetic complications: no   No complications documented.  Orrin Brigham

## 2020-02-28 NOTE — Transfer of Care (Signed)
Immediate Anesthesia Transfer of Care Note  Patient: Tammy Evans  Procedure(s) Performed: COLONOSCOPY WITH PROPOFOL (N/A Rectum)  Patient Location: PACU  Anesthesia Type: General  Level of Consciousness: awake, alert  and patient cooperative  Airway and Oxygen Therapy: Patient Spontanous Breathing and Patient connected to supplemental oxygen  Post-op Assessment: Post-op Vital signs reviewed, Patient's Cardiovascular Status Stable, Respiratory Function Stable, Patent Airway and No signs of Nausea or vomiting  Post-op Vital Signs: Reviewed and stable  Complications: No complications documented.

## 2020-02-28 NOTE — Anesthesia Preprocedure Evaluation (Signed)
Anesthesia Evaluation  Patient identified by MRN, date of birth, ID band Patient awake    Reviewed: NPO status   History of Anesthesia Complications Negative for: history of anesthetic complications  Airway Mallampati: II  TM Distance: >3 FB Neck ROM: full    Dental no notable dental hx.    Pulmonary neg pulmonary ROS,    Pulmonary exam normal        Cardiovascular Exercise Tolerance: Good negative cardio ROS Normal cardiovascular exam     Neuro/Psych  Headaches, negative psych ROS   GI/Hepatic Neg liver ROS, GERD  Controlled,  Endo/Other  negative endocrine ROS  Renal/GU negative Renal ROS  negative genitourinary   Musculoskeletal  (+) Arthritis ,   Abdominal   Peds  Hematology negative hematology ROS (+)   Anesthesia Other Findings Covid: NEg.  Hcg: NEG.  Reproductive/Obstetrics negative OB ROS                            Anesthesia Physical Anesthesia Plan  ASA: II  Anesthesia Plan: General   Post-op Pain Management:    Induction:   PONV Risk Score and Plan: 2 and TIVA and Propofol infusion  Airway Management Planned:   Additional Equipment:   Intra-op Plan:   Post-operative Plan:   Informed Consent: I have reviewed the patients History and Physical, chart, labs and discussed the procedure including the risks, benefits and alternatives for the proposed anesthesia with the patient or authorized representative who has indicated his/her understanding and acceptance.       Plan Discussed with: CRNA  Anesthesia Plan Comments:        Anesthesia Quick Evaluation

## 2020-02-28 NOTE — H&P (Signed)
Midge Minium, MD [facility] [address] [place], [place] [address] Phone: [phone] Fax : [phone]  Primary Care Physician:  Alba Cory, MD Primary Gastroenterologist:  Dr. Servando Snare  Pre-Procedure History & Physical: HPI:  Tammy Evans is a 49 y.o. female is here for a screening colonoscopy.   Past Medical History:  Diagnosis Date  . Chronic insomnia   . Headache, common migraine    4-5 x/yr  . Menopausal state     Past Surgical History:  Procedure Laterality Date  . CESAREAN SECTION    . culposcopy    . TUBAL LIGATION      Prior to Admission medications   Medication Sig Start Date End Date Taking? Authorizing Provider  meloxicam (MOBIC) 15 MG tablet Take 1 tablet (15 mg total) by mouth daily. 12/09/19  Yes Sowles, Danna Hefty, MD  SUMAtriptan (IMITREX) 100 MG tablet Take 1 tablet (100 mg total) by mouth once for 1 dose. 12/09/19 02/28/20 Yes Sowles, Danna Hefty, MD  traZODone (DESYREL) 50 MG tablet Take 0.5-1 tablets (25-50 mg total) by mouth at bedtime as needed for sleep. 12/09/19  Yes Sowles, Danna Hefty, MD  valACYclovir (VALTREX) 500 MG tablet TAKE 1 TABLET (500 MG TOTAL) BY MOUTH DAILY. TAKE 3 TIMES A DAY FOR OUTBREAKS 12/09/19  Yes Sowles, Danna Hefty, MD  Vitamin D, Ergocalciferol, (DRISDOL) 1.25 MG (50000 UNIT) CAPS capsule Take 1 capsule (50,000 Units total) by mouth every 7 (seven) days. 12/17/19  Yes Alba Cory, MD    Allergies as of 01/29/2020  . (No Known Allergies)    Family History  Problem Relation Age of Onset  . Diabetes Father   . Heart disease Father   . Hypertension Father   . Drug abuse Sister   . Sickle cell trait Daughter   . Breast cancer Neg Hx     Social History   Socioeconomic History  . Marital status: Single    Spouse name: Not on file  . Number of children: 2  . Years of education: Not on file  . Highest education level: Not on file  Occupational History  . Occupation: Firefighter  Tobacco Use  . Smoking  status: Never Smoker  . Smokeless tobacco: Never Used  Vaping Use  . Vaping Use: Never used  Substance and Sexual Activity  . Alcohol use: Yes    Alcohol/week: 0.0 standard drinks    Comment: occasionally (1x/month)  . Drug use: No  . Sexual activity: Yes    Birth control/protection: Condom    Comment: Tubal Ligation   Other Topics Concern  . 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   She is living alone now.  She has grown children from previous relationship   Social Determinants of Corporate investment banker Strain:   . Difficulty of Paying Living Expenses: Not on file  Food Insecurity:   . Worried About Programme researcher, broadcasting/film/video in the Last Year: Not on file  . Ran Out of Food in the Last Year: Not on file  Transportation Needs:   . Lack of Transportation (Medical): Not on file  . Lack of Transportation (Non-Medical): Not on file  Physical Activity:   . Days of Exercise per Week: Not on file  . Minutes of Exercise per Session: Not on file  Stress:   . Feeling of Stress : Not on file  Social Connections:   . Frequency of Communication with Friends and Family: Not on  file  . Frequency of Social Gatherings with Friends and Family: Not on file  . Attends Religious Services: Not on file  . Active Member of Clubs or Organizations: Not on file  . Attends Banker Meetings: Not on file  . Marital Status: Not on file  Intimate Partner Violence:   . Fear of Current or Ex-Partner: Not on file  . Emotionally Abused: Not on file  . Physically Abused: Not on file  . Sexually Abused: Not on file    Review of Systems: See HPI, otherwise negative ROS  Physical Exam: BP 134/89   Pulse 61   Temp 97.6 F (36.4 C) (Temporal)   Ht 5\' 6"  (1.676 m)   Wt 66 kg   LMP 05/13/2019 (Approximate)   SpO2 99%   BMI 23.48 kg/m  General:   Alert,  pleasant and cooperative in NAD Head:  Normocephalic and atraumatic. Neck:  Supple; no masses  or thyromegaly. Lungs:  Clear throughout to auscultation.    Heart:  Regular rate and rhythm. Abdomen:  Soft, nontender and nondistended. Normal bowel sounds, without guarding, and without rebound.   Neurologic:  Alert and  oriented x4;  grossly normal neurologically.  Impression/Plan: Tammy Evans is now here to undergo a screening colonoscopy.  Risks, benefits, and alternatives regarding colonoscopy have been reviewed with the patient.  Questions have been answered.  All parties agreeable.

## 2020-02-28 NOTE — Op Note (Addendum)
[facility] Patient Name: Tammy Evans Procedure Date: 02/28/2020 9:55 AM MRN: [id number] Account #: [id number] Date of Birth: [birth year] Admit Type: Outpatient Age: 49 Room: [facility] OR ROOM 01 Gender: Female Note Status: Supervisor Override Procedure:             Colonoscopy Indications:           Screening for colorectal malignant neoplasm Providers:             Midge Minium MD, MD Referring MD:          Onnie Boer. Sowles, MD (Referring MD) Medicines:             Propofol per Anesthesia Complications:         No immediate complications. Procedure:             Pre-Anesthesia Assessment:                        - Prior to the procedure, a History and Physical was                         performed, and patient medications and allergies were                         reviewed. The patient's tolerance of previous                         anesthesia was also reviewed. The risks and benefits                         of the procedure and the sedation options and risks                         were discussed with the patient. All questions were                         answered, and informed consent was obtained. Prior                         Anticoagulants: The patient has taken no previous                         anticoagulant or antiplatelet agents. ASA Grade                         Assessment: II - A patient with mild systemic disease.                         After reviewing the risks and benefits, the patient                         was deemed in satisfactory condition to undergo the                         procedure.                        After obtaining informed consent, the colonoscope was  passed under direct vision. Throughout the procedure,                         the patient's blood pressure, pulse, and oxygen                         saturations were monitored continuously. The was                         introduced through the  anus and advanced to the the                         cecum, identified by appendiceal orifice and ileocecal                         valve. The colonoscopy was performed without                         difficulty. The patient tolerated the procedure well.                         The quality of the bowel preparation was excellent. Findings:      The perianal and digital rectal examinations were normal.      Non-bleeding internal hemorrhoids were found during retroflexion. The       hemorrhoids were Grade I (internal hemorrhoids that do not prolapse). Impression:            - Non-bleeding internal hemorrhoids.                        - No specimens collected. Recommendation:        - Discharge patient to home.                        - Resume previous diet.                        - Continue present medications.                        - Repeat colonoscopy in 10 years for screening                         purposes.                        - unless any change in family history or lower GI                         problems. Procedure Code(s):     --- Professional ---                        [phone], Colonoscopy, flexible; diagnostic, including                         collection of specimen(s) by brushing or washing, when                         performed (separate procedure) Diagnosis Code(s):     --- Professional ---  Z12.11, Encounter for screening for malignant neoplasm                         of colon CPT copyright 2019 American Medical [employer]. All rights reserved. The codes documented in this report are preliminary and upon coder review may  be revised to meet current compliance requirements. Midge Minium MD, MD 02/28/2020 10:25:38 AM This report has been signed electronically. Number of Addenda: 0 Note Initiated On: 02/28/2020 9:55 AM Scope Withdrawal Time: 0 hours 8 minutes 16 seconds  Total Procedure Duration: 0 hours 14 minutes 15 seconds  Estimated Blood Loss:   Estimated blood loss: none.      [facility]

## 2020-03-02 ENCOUNTER — Encounter: Payer: Self-pay | Admitting: Gastroenterology

## 2020-04-29 ENCOUNTER — Telehealth: Payer: Self-pay

## 2020-04-29 NOTE — Telephone Encounter (Signed)
Copied from CRM [id number]. Topic: General - Other >> Apr 29, 2020  4:20 PM Tammy Evans E wrote: Reason for CRM: Pt has an FMLA case but her employer is not satisfied with how it is being used or completed and the pt aske if she needs to schedule an appt to get this redone to her employers liking or if she can just drop off paperwork/ Pt has scheduled an appt in case/ Pt states she needs to have the paper work back in by 1.29.22/please advise

## 2020-05-04 NOTE — Progress Notes (Signed)
Name: Tammy Evans   MRN: [id number]    DOB: 1970/11/11   Date:05/05/2020       Progress Note  Subjective  Chief Complaint  FMLA/Paperwork/Follow up   HPI    Hot Flashes: She has been having hot flashes x 1-2 years. She is unable to sleep at night, we tried Clonidine in the past but did not work. She works first shift now. She is now taking Trazodone but only taking prn and sometimes takes it too long and is unable to get up in am   Migraine headaches: She continues to have migraines intermittently. Certain things like different smells and lack of sleep trigger her migraines. She has been having more episodes since the suicide of her boyfriend - 02/2020  She cannot sleep well at night, losing weight, crying more often . She needs FMLA forms filled out again so she can keep her job. She has been missing more hours per week. She takes imitrex prn    Low platelets and white count: resolved, based on last labs   Pre-diabetes: last A1C was over one year ago, it was 5.7 % . He denies polyphagia, polydipsia or polyuria.   Grieving: she was dating someone for 3 years , but on their last date 10/20 they had a disagreement.and on Nov 18 th he committed suicide. He was acting weird - he did not want to share his meals with her ( from family style meal). She has knew him since HS. She has been really affected by it. He was also harsh to her during dinner and during their drive home she was afraid to talk because he was afraid to trigger him.She has been having difficulty sleeping, crying. Discussed counseling .   Patient Active Problem List   Diagnosis Date Noted  . Encounter for screening colonoscopy   . Knee pain, left 12/06/2019  . Pre-diabetes 01/01/2018  . Major depression in remission (HCC) 12/27/2017  . Other neutropenia (HCC) 08/08/2016  . Genital herpes 08/08/2016  . GAD (generalized anxiety disorder) 11/10/2015  . Migraine without aura and responsive to treatment 01/13/2015  .  Insomnia, persistent 01/13/2015  . Night sweats 01/13/2015    Past Surgical History:  Procedure Laterality Date  . CESAREAN SECTION    . COLONOSCOPY WITH PROPOFOL N/A 02/28/2020   Procedure: COLONOSCOPY WITH PROPOFOL;  Surgeon: Midge Minium, MD;  Location: [facility];  Service: Endoscopy;  Laterality: N/A;  priority 4  . culposcopy    . TUBAL LIGATION      Family History  Problem Relation Age of Onset  . Diabetes Father   . Heart disease Father   . Hypertension Father   . Drug abuse Sister   . Sickle cell trait Daughter   . Breast cancer Neg Hx     Social History   Tobacco Use  . Smoking status: Never Smoker  . Smokeless tobacco: Never Used  Substance Use Topics  . Alcohol use: Yes    Alcohol/week: 0.0 standard drinks    Comment: occasionally (1x/month)     Current Outpatient Medications:  .  fluconazole (DIFLUCAN) 150 MG tablet, Take 150 mg by mouth once., Disp: , Rfl:  .  meloxicam (MOBIC) 15 MG tablet, Take 1 tablet (15 mg total) by mouth daily., Disp: 30 tablet, Rfl: 0 .  traZODone (DESYREL) 50 MG tablet, Take 0.5-1 tablets (25-50 mg total) by mouth at bedtime as needed for sleep., Disp: 30 tablet, Rfl: 0 .  valACYclovir (VALTREX) 500 MG tablet,  TAKE 1 TABLET (500 MG TOTAL) BY MOUTH DAILY. TAKE 3 TIMES A DAY FOR OUTBREAKS, Disp: 35 tablet, Rfl: 1 .  Vitamin D, Ergocalciferol, (DRISDOL) 1.25 MG (50000 UNIT) CAPS capsule, Take 1 capsule (50,000 Units total) by mouth every 7 (seven) days., Disp: 12 capsule, Rfl: 1 .  GAVILYTE-G 236 g solution, SMARTSIG:Milliliter(s) By Mouth (Patient not taking: Reported on 05/05/2020), Disp: , Rfl:  .  SUMAtriptan (IMITREX) 100 MG tablet, Take 1 tablet (100 mg total) by mouth once for 1 dose., Disp: 10 tablet, Rfl: 2  No Known Allergies  I personally reviewed active problem list, medication list, allergies, family history, social history, health maintenance with the patient/caregiver today.   ROS  Constitutional: Negative  for fever , positive for  weight change.  Respiratory: Negative for cough and shortness of breath.   Cardiovascular: Negative for chest pain or palpitations.  Gastrointestinal: Negative for abdominal pain, no bowel changes.  Musculoskeletal: Negative for gait problem or joint swelling.  Skin: Negative for rash.  Neurological: Negative for dizziness , positive for headache.  No other specific complaints in a complete review of systems (except as listed in HPI above).  Objective  Vitals:   05/05/20 1515  BP: 112/72  Pulse: 98  Resp: 14  Temp: 97.8 F (36.6 C)  TempSrc: Oral  SpO2: 100%  Weight: 139 lb 4.8 oz (63.2 kg)  Height: 5\' 6"  (1.676 m)    Body mass index is 22.48 kg/m.  Physical Exam  Constitutional: Patient appears well-developed and well-nourished. No distress.  HEENT: head atraumatic, normocephalic, pupils equal and reactive to light,  neck supple Cardiovascular: Normal rate, regular rhythm and normal heart sounds.  No murmur heard. No BLE edema. Pulmonary/Chest: Effort normal and breath sounds normal. No respiratory distress. Abdominal: Soft.  There is no tenderness. Psychiatric: Patient has a depressed mood, crying . behavior is normal. Judgment and thought content normal.  Recent Results (from the past 2160 hour(s))  SARS CORONAVIRUS 2 (TAT 6-24 HRS) Nasopharyngeal Nasopharyngeal Swab     Status: None   Collection Time: 02/26/20 12:00 PM   Specimen: Nasopharyngeal Swab  Result Value Ref Range   SARS Coronavirus 2 NEGATIVE NEGATIVE    Comment: (NOTE) SARS-CoV-2 target nucleic acids are NOT DETECTED.  The SARS-CoV-2 RNA is generally detectable in upper and lower respiratory specimens during the acute phase of infection. Negative results do not preclude SARS-CoV-2 infection, do not rule out co-infections with other pathogens, and should not be used as the sole basis for treatment or other patient management decisions. Negative results must be combined with  clinical observations, patient history, and epidemiological information. The expected result is Negative.  Fact Sheet for Patients: 02/28/20  Fact Sheet for Healthcare Providers: HairSlick.no  This test is not yet approved or cleared by the quierodirigir.com FDA and  has been authorized for detection and/or diagnosis of SARS-CoV-2 by FDA under an Emergency Use Authorization (EUA). This EUA will remain  in effect (meaning this test can be used) for the duration of the COVID-19 declaration under Se ction 564(b)(1) of the Act, 21 U.S.C. section 360bbb-3(b)(1), unless the authorization is terminated or revoked sooner.  Performed at [facility], [address], [place], [place]   Pregnancy, urine POC     Status: None   Collection Time: 02/28/20  9:21 AM  Result Value Ref Range   Preg Test, Ur NEGATIVE NEGATIVE    Comment:        THE SENSITIVITY OF THIS  METHODOLOGY IS >24 mIU/mL       PHQ2/9: Depression screen [facility] 2/9 05/05/2020 12/09/2019 12/27/2017 12/27/2017 02/20/2017  Decreased Interest 0 0 0 0 0  Down, Depressed, Hopeless 1 0 0 0 0  PHQ - 2 Score 1 0 0 0 0  Altered sleeping 2 2 3  - -  Tired, decreased energy 0 1 2 - -  Change in appetite 0 0 0 - -  Feeling bad or failure about yourself  0 0 0 - -  Trouble concentrating 0 0 0 - -  Moving slowly or fidgety/restless 0 0 0 - -  Suicidal thoughts 0 0 0 - -  PHQ-9 Score 3 3 5  - -  Difficult doing work/chores Somewhat difficult Somewhat difficult Not difficult at all - -    phq 9 is negative   Fall Risk: Fall Risk  05/05/2020 12/09/2019 12/27/2017 02/20/2017 08/08/2016  Falls in the past year? 0 0 No No No  Number falls in past yr: 0 0 - - -  Injury with Fall? 0 0 - - -    Functional Status Survey: Is the patient deaf or have difficulty hearing?: No Does the patient have difficulty seeing, even when wearing glasses/contacts?: No Does the patient  have difficulty concentrating, remembering, or making decisions?: Yes Does the patient have difficulty walking or climbing stairs?: No Does the patient have difficulty dressing or bathing?: No Does the patient have difficulty doing errands alone such as visiting a doctor's office or shopping?: No    Assessment & Plan  1. Insomnia, unspecified type  - traZODone (DESYREL) 50 MG tablet; Take 0.5-1 tablets (25-50 mg total) by mouth at bedtime as needed for sleep.  Dispense: 90 tablet; Refill: 0  2. Vitamin D deficiency  - Vitamin D, Ergocalciferol, (DRISDOL) 1.25 MG (50000 UNIT) CAPS capsule; Take 1 capsule (50,000 Units total) by mouth every 7 (seven) days.  Dispense: 12 capsule; Refill: 1  3. Migraine without aura and responsive to treatment  More frequent episodes, due to grieving, lack of sleep . We will fill out FMLA forms Discussed counseling and also take trazodone every night   4. Pre-diabetes   5. Grieving

## 2020-05-05 ENCOUNTER — Encounter: Payer: Self-pay | Admitting: Family Medicine

## 2020-05-05 ENCOUNTER — Ambulatory Visit: Payer: Federal, State, Local not specified - PPO | Admitting: Family Medicine

## 2020-05-05 ENCOUNTER — Other Ambulatory Visit: Payer: Self-pay

## 2020-05-05 VITALS — BP 112/72 | HR 98 | Temp 97.8°F | Resp 14 | Ht 66.0 in | Wt 139.3 lb

## 2020-05-05 DIAGNOSIS — E559 Vitamin D deficiency, unspecified: Secondary | ICD-10-CM

## 2020-05-05 DIAGNOSIS — G4700 Insomnia, unspecified: Secondary | ICD-10-CM | POA: Diagnosis not present

## 2020-05-05 DIAGNOSIS — G43009 Migraine without aura, not intractable, without status migrainosus: Secondary | ICD-10-CM

## 2020-05-05 DIAGNOSIS — R7303 Prediabetes: Secondary | ICD-10-CM

## 2020-05-05 DIAGNOSIS — F4321 Adjustment disorder with depressed mood: Secondary | ICD-10-CM

## 2020-05-05 MED ORDER — TRAZODONE HCL 50 MG PO TABS: 25.0000 mg | ORAL_TABLET | Freq: Every evening | ORAL | 0 refills | Status: DC | PRN

## 2020-05-05 MED ORDER — VITAMIN D (ERGOCALCIFEROL) 1.25 MG (50000 UNIT) PO CAPS: 50000.0000 [IU] | ORAL_CAPSULE | ORAL | 1 refills | Status: DC

## 2020-05-19 ENCOUNTER — Ambulatory Visit: Payer: Federal, State, Local not specified - PPO | Admitting: Family Medicine

## 2020-06-03 ENCOUNTER — Other Ambulatory Visit: Payer: Self-pay | Admitting: Family Medicine

## 2020-06-03 DIAGNOSIS — Z1231 Encounter for screening mammogram for malignant neoplasm of breast: Secondary | ICD-10-CM

## 2020-06-08 ENCOUNTER — Ambulatory Visit: Payer: Federal, State, Local not specified - PPO | Admitting: Family Medicine

## 2020-07-07 NOTE — Progress Notes (Deleted)
Name: Tammy Evans   MRN: [id number]    DOB: Sep 16, 1970   Date:07/07/2020       Progress Note  Subjective  Chief Complaint  Follow up   HPI Hot Flashes: She has been having hot flashes x 1-2 years. She is unable to sleep at night, we tried Clonidine in the past but did not work. She works first shift now. She is now taking Trazodone but only taking prn and sometimes takes it too long and is unable to get up in am   Migraine headaches: She continues to have migraines intermittently. Certain things like different smells and lack of sleep trigger her migraines. She has been having more episodes since the suicide of her boyfriend - 02/2020  She cannot sleep well at night, losing weight, crying more often . She needs FMLA forms filled out again so she can keep her job. She has been missing more hours per week. She takes imitrex prn    Low platelets and white count: resolved, based on last labs   Pre-diabetes: last A1C was over one year ago, it was 5.7 % . He denies polyphagia, polydipsia or polyuria.   Grieving: she was dating someone for 3 years , but on their last date 10/20 they had a disagreement.and on Nov 18 th he committed suicide. He was acting weird - he did not want to share his meals with her ( from family style meal). She has knew him since HS. She has been really affected by it. He was also harsh to her during dinner and during their drive home she was afraid to talk because he was afraid to trigger him.She has been having difficulty sleeping, crying. Discussed counseling .  *** Patient Active Problem List   Diagnosis Date Noted  . Encounter for screening colonoscopy   . Knee pain, left 12/06/2019  . Pre-diabetes 01/01/2018  . Major depression in remission (HCC) 12/27/2017  . Other neutropenia (HCC) 08/08/2016  . Genital herpes 08/08/2016  . GAD (generalized anxiety disorder) 11/10/2015  . Migraine without aura and responsive to treatment 01/13/2015  . Insomnia,  persistent 01/13/2015  . Night sweats 01/13/2015    Past Surgical History:  Procedure Laterality Date  . CESAREAN SECTION    . COLONOSCOPY WITH PROPOFOL N/A 02/28/2020   Procedure: COLONOSCOPY WITH PROPOFOL;  Surgeon: Midge Minium, MD;  Location: [facility];  Service: Endoscopy;  Laterality: N/A;  priority 4  . culposcopy    . TUBAL LIGATION      Family History  Problem Relation Age of Onset  . Diabetes Father   . Heart disease Father   . Hypertension Father   . Drug abuse Sister   . Sickle cell trait Daughter   . Breast cancer Neg Hx     Social History   Tobacco Use  . Smoking status: Never Smoker  . Smokeless tobacco: Never Used  Substance Use Topics  . Alcohol use: Yes    Alcohol/week: 0.0 standard drinks    Comment: occasionally (1x/month)     Current Outpatient Medications:  .  meloxicam (MOBIC) 15 MG tablet, Take 1 tablet (15 mg total) by mouth daily., Disp: 30 tablet, Rfl: 0 .  SUMAtriptan (IMITREX) 100 MG tablet, Take 1 tablet (100 mg total) by mouth once for 1 dose., Disp: 10 tablet, Rfl: 2 .  traZODone (DESYREL) 50 MG tablet, Take 0.5-1 tablets (25-50 mg total) by mouth at bedtime as needed for sleep., Disp: 90 tablet, Rfl: 0 .  valACYclovir (  VALTREX) 500 MG tablet, TAKE 1 TABLET (500 MG TOTAL) BY MOUTH DAILY. TAKE 3 TIMES A DAY FOR OUTBREAKS, Disp: 35 tablet, Rfl: 1 .  Vitamin D, Ergocalciferol, (DRISDOL) 1.25 MG (50000 UNIT) CAPS capsule, Take 1 capsule (50,000 Units total) by mouth every 7 (seven) days., Disp: 12 capsule, Rfl: 1  No Known Allergies  I personally reviewed {Reviewed:[id number]} with the patient/caregiver today.   ROS  ***  Objective  There were no vitals filed for this visit.  There is no height or weight on file to calculate BMI.  Physical Exam ***  No results found for this or any previous visit (from the past 2160 hour(s)).  Diabetic Foot Exam: Diabetic Foot Exam - Simple   No data filed     ***  PHQ2/9: Depression screen [facility] 2/9 05/05/2020 12/09/2019 12/27/2017 12/27/2017 02/20/2017  Decreased Interest 0 0 0 0 0  Down, Depressed, Hopeless 1 0 0 0 0  PHQ - 2 Score 1 0 0 0 0  Altered sleeping 2 2 3  - -  Tired, decreased energy 0 1 2 - -  Change in appetite 0 0 0 - -  Feeling bad or failure about yourself  0 0 0 - -  Trouble concentrating 0 0 0 - -  Moving slowly or fidgety/restless 0 0 0 - -  Suicidal thoughts 0 0 0 - -  PHQ-9 Score 3 3 5  - -  Difficult doing work/chores Somewhat difficult Somewhat difficult Not difficult at all - -    phq 9 is {gen pos ***  Fall Risk: Fall Risk  05/05/2020 12/09/2019 12/27/2017 02/20/2017 08/08/2016  Falls in the past year? 0 0 No No No  Number falls in past yr: 0 0 - - -  Injury with Fall? 0 0 - - -   ***   Functional Status Survey:   ***   Assessment & Plan  *** There are no diagnoses linked to this encounter.

## 2020-07-09 ENCOUNTER — Ambulatory Visit: Payer: Federal, State, Local not specified - PPO | Admitting: Family Medicine

## 2020-10-23 DIAGNOSIS — T3695XA Adverse effect of unspecified systemic antibiotic, initial encounter: Secondary | ICD-10-CM | POA: Diagnosis not present

## 2020-10-23 DIAGNOSIS — N390 Urinary tract infection, site not specified: Secondary | ICD-10-CM | POA: Diagnosis not present

## 2020-10-23 DIAGNOSIS — B379 Candidiasis, unspecified: Secondary | ICD-10-CM | POA: Diagnosis not present

## 2020-10-23 DIAGNOSIS — R350 Frequency of micturition: Secondary | ICD-10-CM | POA: Diagnosis not present

## 2020-11-02 NOTE — Progress Notes (Signed)
Name: Tammy Evans   MRN: [id number]    DOB: Apr 04, 1971   Date:11/03/2020       Progress Note  Subjective  Chief Complaint  Follow Up  HPI  Hot Flashes: She has been having hot flashes x 1-2 years. Doing better now, symptoms not as intense, no longer affecting her sleep   Migraine headaches: She continues to have migraines intermittently. Certain things like different smells and lack of sleep trigger her migraines. She was having more episodes after boyfriend's suicide  - 02/2020  She was not sleeping at night and could not eat. She has spoke to a therapist and is feeling better , weight is back to baseline. Episodes of headaches varies depending on stress level, she will let me know if she needs FMLa forms filled out   Low platelets: values goes up and down, we will recheck it today   Pre-diabetes: last A1C was over one year ago, it was 5.7 % . She  denies polyphagia, polydipsia or polyuria.   Vitamin D deficiency: taking rx vitamin D and we will recheck level   Patient Active Problem List   Diagnosis Date Noted   Encounter for screening colonoscopy    Knee pain, left 12/06/2019   Pre-diabetes 01/01/2018   Major depression in remission (HCC) 12/27/2017   Other neutropenia (HCC) 08/08/2016   Genital herpes 08/08/2016   GAD (generalized anxiety disorder) 11/10/2015   Migraine without aura and responsive to treatment 01/13/2015   Insomnia, persistent 01/13/2015   Night sweats 01/13/2015    Past Surgical History:  Procedure Laterality Date   CESAREAN SECTION     COLONOSCOPY WITH PROPOFOL N/A 02/28/2020   Procedure: COLONOSCOPY WITH PROPOFOL;  Surgeon: Midge Minium, MD;  Location: [facility];  Service: Endoscopy;  Laterality: N/A;  priority 4   culposcopy     TUBAL LIGATION      Family History  Problem Relation Age of Onset   Diabetes Father    Heart disease Father    Hypertension Father    Drug abuse Sister    Sickle cell trait Daughter    Breast cancer  Neg Hx     Social History   Tobacco Use   Smoking status: Never   Smokeless tobacco: Never  Substance Use Topics   Alcohol use: Yes    Alcohol/week: 0.0 standard drinks    Comment: occasionally (1x/month)     Current Outpatient Medications:    SUMAtriptan (IMITREX) 100 MG tablet, Take 1 tablet (100 mg total) by mouth once for 1 dose., Disp: 10 tablet, Rfl: 2   traZODone (DESYREL) 50 MG tablet, Take 0.5-1 tablets (25-50 mg total) by mouth at bedtime as needed for sleep., Disp: 90 tablet, Rfl: 0   valACYclovir (VALTREX) 500 MG tablet, TAKE 1 TABLET (500 MG TOTAL) BY MOUTH DAILY. TAKE 3 TIMES A DAY FOR OUTBREAKS, Disp: 35 tablet, Rfl: 1   Vitamin D, Ergocalciferol, (DRISDOL) 1.25 MG (50000 UNIT) CAPS capsule, Take 1 capsule (50,000 Units total) by mouth every 7 (seven) days., Disp: 12 capsule, Rfl: 1  Allergies  Allergen Reactions   Macrobid [Nitrofurantoin]     headache    I personally reviewed active problem list, medication list, allergies, family history, social history, health maintenance with the patient/caregiver today.   ROS  Constitutional: Negative for fever or weight change.  Respiratory: Negative for cough and shortness of breath.   Cardiovascular: Negative for chest pain or palpitations.  Gastrointestinal: Negative for abdominal pain, no bowel changes.  Musculoskeletal: Negative for gait problem or joint swelling.  Skin: Negative for rash.  Neurological: Negative for dizziness or headache.  No other specific complaints in a complete review of systems (except as listed in HPI above).   Objective  Vitals:   11/03/20 1544  BP: 122/82  Pulse: 66  Resp: 16  Temp: 97.9 F (36.6 C)  SpO2: 99%  Weight: 151 lb (68.5 kg)  Height: 5\' 6"  (1.676 m)    Body mass index is 24.37 kg/m.  Physical Exam  Constitutional: Patient appears well-developed and well-nourished. Overweight.  No distress.  HEENT: head atraumatic, normocephalic, pupils equal and reactive to  light, neck supple Cardiovascular: Normal rate, regular rhythm and normal heart sounds.  No murmur heard. No BLE edema. Pulmonary/Chest: Effort normal and breath sounds normal. No respiratory distress. Abdominal: Soft.  There is no tenderness. Psychiatric: Patient has a normal mood and affect. behavior is normal. Judgment and thought content normal.   PHQ2/9: Depression screen [facility] 2/9 11/03/2020 05/05/2020 12/09/2019 12/27/2017 12/27/2017  Decreased Interest 0 0 0 0 0  Down, Depressed, Hopeless 0 1 0 0 0  PHQ - 2 Score 0 1 0 0 0  Altered sleeping 0 2 2 3  -  Tired, decreased energy 0 0 1 2 -  Change in appetite 0 0 0 0 -  Feeling bad or failure about yourself  0 0 0 0 -  Trouble concentrating 0 0 0 0 -  Moving slowly or fidgety/restless 0 0 0 0 -  Suicidal thoughts 0 0 0 0 -  PHQ-9 Score 0 3 3 5  -  Difficult doing work/chores Not difficult at all Somewhat difficult Somewhat difficult Not difficult at all -    phq 9 is negative   Fall Risk: Fall Risk  11/03/2020 05/05/2020 12/09/2019 12/27/2017 02/20/2017  Falls in the past year? 0 0 0 No No  Number falls in past yr: 0 0 0 - -  Injury with Fall? 0 0 0 - -     Functional Status Survey: Is the patient deaf or have difficulty hearing?: No Does the patient have difficulty seeing, even when wearing glasses/contacts?: No Does the patient have difficulty concentrating, remembering, or making decisions?: No Does the patient have difficulty walking or climbing stairs?: No Does the patient have difficulty dressing or bathing?: No Does the patient have difficulty doing errands alone such as visiting a doctor's office or shopping?: No    Assessment & Plan   1. Migraine without aura and responsive to treatment  - SUMAtriptan (IMITREX) 100 MG tablet; Take 1 tablet (100 mg total) by mouth once for 1 dose.  Dispense: 10 tablet; Refill: 2  2. Pre-diabetes  - Hemoglobin A1c  3. Vitamin D deficiency  - VITAMIN D 25 Hydroxy (Vit-D Deficiency,  Fractures) - Vitamin D, Ergocalciferol, (DRISDOL) 1.25 MG (50000 UNIT) CAPS capsule; Take 1 capsule (50,000 Units total) by mouth every 7 (seven) days.  Dispense: 12 capsule; Refill: 1  4. Thrombocytopenia (HCC)  - CBC with Differential/Platelet  5. Hot flashes   6. Lipid screening  - Lipid panel  7. Insomnia, unspecified type  - traZODone (DESYREL) 50 MG tablet; Take 0.5-1 tablets (25-50 mg total) by mouth at bedtime as needed for sleep.  Dispense: 90 tablet; Refill: 0  8. Routine screening for STI (sexually transmitted infection)  - Cervicovaginal ancillary only - HIV Antibody (routine testing w rflx) - RPR  9. Long-term use of high-risk medication  - COMPLETE METABOLIC PANEL WITH GFR  10.  Genital herpes simplex, unspecified site  - valACYclovir (VALTREX) 500 MG tablet; TAKE 1 TABLET (500 MG TOTAL) BY MOUTH DAILY. TAKE 3 TIMES A DAY FOR OUTBREAKS  Dispense: 35 tablet; Refill: 1

## 2020-11-03 ENCOUNTER — Other Ambulatory Visit (HOSPITAL_COMMUNITY)
Admission: RE | Admit: 2020-11-03 | Discharge: 2020-11-03 | Disposition: A | Discharge status: Home / Self Care | Payer: Federal, State, Local not specified - PPO | Source: Ambulatory Visit | Attending: Family Medicine | Admitting: Family Medicine

## 2020-11-03 ENCOUNTER — Encounter: Payer: Self-pay | Admitting: Family Medicine

## 2020-11-03 ENCOUNTER — Ambulatory Visit: Payer: Federal, State, Local not specified - PPO | Admitting: Family Medicine

## 2020-11-03 ENCOUNTER — Other Ambulatory Visit: Payer: Self-pay

## 2020-11-03 VITALS — BP 122/82 | HR 66 | Temp 97.9°F | Resp 16 | Ht 66.0 in | Wt 151.0 lb

## 2020-11-03 DIAGNOSIS — Z113 Encounter for screening for infections with a predominantly sexual mode of transmission: Secondary | ICD-10-CM

## 2020-11-03 DIAGNOSIS — G43009 Migraine without aura, not intractable, without status migrainosus: Secondary | ICD-10-CM

## 2020-11-03 DIAGNOSIS — G4700 Insomnia, unspecified: Secondary | ICD-10-CM

## 2020-11-03 DIAGNOSIS — A6000 Herpesviral infection of urogenital system, unspecified: Secondary | ICD-10-CM

## 2020-11-03 DIAGNOSIS — Z13220 Encounter for screening for lipoid disorders: Secondary | ICD-10-CM

## 2020-11-03 DIAGNOSIS — R232 Flushing: Secondary | ICD-10-CM

## 2020-11-03 DIAGNOSIS — R7303 Prediabetes: Secondary | ICD-10-CM | POA: Diagnosis not present

## 2020-11-03 DIAGNOSIS — E559 Vitamin D deficiency, unspecified: Secondary | ICD-10-CM

## 2020-11-03 DIAGNOSIS — Z79899 Other long term (current) drug therapy: Secondary | ICD-10-CM | POA: Diagnosis not present

## 2020-11-03 DIAGNOSIS — D696 Thrombocytopenia, unspecified: Secondary | ICD-10-CM | POA: Diagnosis not present

## 2020-11-03 MED ORDER — TRAZODONE HCL 50 MG PO TABS: 25.0000 mg | ORAL_TABLET | Freq: Every evening | ORAL | 0 refills | Status: DC | PRN

## 2020-11-03 MED ORDER — VALACYCLOVIR HCL 500 MG PO TABS: ORAL_TABLET | ORAL | 1 refills | Status: DC

## 2020-11-03 MED ORDER — SUMATRIPTAN SUCCINATE 100 MG PO TABS: 100.0000 mg | ORAL_TABLET | Freq: Once | ORAL | 2 refills | Status: DC

## 2020-11-03 MED ORDER — VITAMIN D (ERGOCALCIFEROL) 1.25 MG (50000 UNIT) PO CAPS: 50000.0000 [IU] | ORAL_CAPSULE | ORAL | 1 refills | Status: DC

## 2020-11-04 LAB — CBC WITH DIFFERENTIAL/PLATELET
Absolute Monocytes: 310 cells/uL (ref 200–950)
Basophils Absolute: 20 cells/uL (ref 0–200)
Basophils Relative: 0.7 %
Eosinophils Absolute: 81 cells/uL (ref 15–500)
Eosinophils Relative: 2.8 %
HCT: 39.3 % (ref 35.0–45.0)
Hemoglobin: 13.2 g/dL (ref 11.7–15.5)
Lymphs Abs: 751 cells/uL — ABNORMAL LOW (ref 850–3900)
MCH: 30 pg (ref 27.0–33.0)
MCHC: 33.6 g/dL (ref 32.0–36.0)
MCV: 89.3 fL (ref 80.0–100.0)
MPV: 12.5 fL (ref 7.5–12.5)
Monocytes Relative: 10.7 %
Neutro Abs: 1737 cells/uL (ref 1500–7800)
Neutrophils Relative %: 59.9 %
Platelets: 170 10*3/uL (ref 140–400)
RBC: 4.4 10*6/uL (ref 3.80–5.10)
RDW: 14.1 % (ref 11.0–15.0)
Total Lymphocyte: 25.9 %
WBC: 2.9 10*3/uL — ABNORMAL LOW (ref 3.8–10.8)

## 2020-11-04 LAB — COMPLETE METABOLIC PANEL WITH GFR
AG Ratio: 1.8 (calc) (ref 1.0–2.5)
ALT: 17 U/L (ref 6–29)
AST: 18 U/L (ref 10–35)
Albumin: 4.4 g/dL (ref 3.6–5.1)
Alkaline phosphatase (APISO): 86 U/L (ref 37–153)
BUN: 16 mg/dL (ref 7–25)
CO2: 30 mmol/L (ref 20–32)
Calcium: 9.4 mg/dL (ref 8.6–10.4)
Chloride: 104 mmol/L (ref 98–110)
Creat: 0.95 mg/dL (ref 0.50–1.03)
Globulin: 2.5 g/dL (calc) (ref 1.9–3.7)
Glucose, Bld: 85 mg/dL (ref 65–99)
Potassium: 4.2 mmol/L (ref 3.5–5.3)
Sodium: 138 mmol/L (ref 135–146)
Total Bilirubin: 0.9 mg/dL (ref 0.2–1.2)
Total Protein: 6.9 g/dL (ref 6.1–8.1)
eGFR: 72 mL/min/{1.73_m2} (ref >=60)

## 2020-11-04 LAB — LIPID PANEL
Cholesterol: 213 mg/dL — ABNORMAL HIGH (ref <200)
HDL: 76 mg/dL (ref >=50)
LDL Cholesterol (Calc): 119 mg/dL (calc) — ABNORMAL HIGH
Non-HDL Cholesterol (Calc): 137 mg/dL (calc) — ABNORMAL HIGH (ref <130)
Total CHOL/HDL Ratio: 2.8 (calc) (ref <5.0)
Triglycerides: 79 mg/dL (ref <150)

## 2020-11-04 LAB — RPR: RPR Ser Ql: NONREACTIVE

## 2020-11-04 LAB — HEMOGLOBIN A1C
Hgb A1c MFr Bld: 5.5 % of total Hgb (ref <5.7)
Mean Plasma Glucose: 111 mg/dL
eAG (mmol/L): 6.2 mmol/L

## 2020-11-04 LAB — VITAMIN D 25 HYDROXY (VIT D DEFICIENCY, FRACTURES): Vit D, 25-Hydroxy: 19 ng/mL — ABNORMAL LOW (ref 30–100)

## 2020-11-04 LAB — HIV ANTIBODY (ROUTINE TESTING W REFLEX): HIV 1&2 Ab, 4th Generation: NONREACTIVE

## 2020-11-05 LAB — CERVICOVAGINAL ANCILLARY ONLY
Chlamydia: NEGATIVE
Comment: NEGATIVE
Comment: NORMAL
Neisseria Gonorrhea: NEGATIVE

## 2021-01-29 ENCOUNTER — Other Ambulatory Visit (HOSPITAL_COMMUNITY)
Admission: RE | Admit: 2021-01-29 | Discharge: 2021-01-29 | Disposition: A | Discharge status: Home / Self Care | Payer: Federal, State, Local not specified - PPO | Source: Ambulatory Visit | Attending: Family Medicine | Admitting: Family Medicine

## 2021-01-29 ENCOUNTER — Ambulatory Visit (INDEPENDENT_AMBULATORY_CARE_PROVIDER_SITE_OTHER): Payer: Federal, State, Local not specified - PPO | Admitting: Family Medicine

## 2021-01-29 ENCOUNTER — Encounter: Payer: Self-pay | Admitting: Family Medicine

## 2021-01-29 ENCOUNTER — Other Ambulatory Visit: Payer: Self-pay

## 2021-01-29 VITALS — BP 130/84 | HR 87 | Temp 98.0°F | Resp 16 | Ht 66.0 in | Wt 150.0 lb

## 2021-01-29 DIAGNOSIS — Z1231 Encounter for screening mammogram for malignant neoplasm of breast: Secondary | ICD-10-CM

## 2021-01-29 DIAGNOSIS — Z124 Encounter for screening for malignant neoplasm of cervix: Secondary | ICD-10-CM

## 2021-01-29 DIAGNOSIS — N841 Polyp of cervix uteri: Secondary | ICD-10-CM

## 2021-01-29 DIAGNOSIS — Z23 Encounter for immunization: Secondary | ICD-10-CM

## 2021-01-29 DIAGNOSIS — Z0000 Encounter for general adult medical examination without abnormal findings: Secondary | ICD-10-CM | POA: Diagnosis not present

## 2021-01-29 NOTE — Patient Instructions (Signed)
Preventive Care 40-52 Years Old, Female Preventive care refers to lifestyle choices and visits with your health care provider that can promote health and wellness. This includes: A yearly physical exam. This is also called an annual wellness visit. Regular dental and eye exams. Immunizations. Screening for certain conditions. Healthy lifestyle choices, such as: Eating a healthy diet. Getting regular exercise. Not using drugs or products that contain nicotine and tobacco. Limiting alcohol use. What can I expect for my preventive care visit? Physical exam Your health care provider will check your: Height and weight. These may be used to calculate your BMI (body mass index). BMI is a measurement that tells if you are at a healthy weight. Heart rate and blood pressure. Body temperature. Skin for abnormal spots. Counseling Your health care provider may ask you questions about your: Past medical problems. Family's medical history. Alcohol, tobacco, and drug use. Emotional well-being. Home life and relationship well-being. Sexual activity. Diet, exercise, and sleep habits. Work and work environment. Access to firearms. Method of birth control. Menstrual cycle. Pregnancy history. What immunizations do I need? Vaccines are usually given at various ages, according to a schedule. Your health care provider will recommend vaccines for you based on your age, medical history, and lifestyle or other factors, such as travel or where you work. What tests do I need? Blood tests Lipid and cholesterol levels. These may be checked every 5 years, or more often if you are over 50 years old. Hepatitis C test. Hepatitis B test. Screening Lung cancer screening. You may have this screening every year starting at age 55 if you have a 30-pack-year history of smoking and currently smoke or have quit within the past 15 years. Colorectal cancer screening. All adults should have this screening starting at  age 50 and continuing until age 75. Your health care provider may recommend screening at age 45 if you are at increased risk. You will have tests every 1-10 years, depending on your results and the type of screening test. Diabetes screening. This is done by checking your blood sugar (glucose) after you have not eaten for a while (fasting). You may have this done every 1-3 years. Mammogram. This may be done every 1-2 years. Talk with your health care provider about when you should start having regular mammograms. This may depend on whether you have a family history of breast cancer. BRCA-related cancer screening. This may be done if you have a family history of breast, ovarian, tubal, or peritoneal cancers. Pelvic exam and Pap test. This may be done every 3 years starting at age 21. Starting at age 30, this may be done every 5 years if you have a Pap test in combination with an HPV test. Other tests STD (sexually transmitted disease) testing, if you are at risk. Bone density scan. This is done to screen for osteoporosis. You may have this scan if you are at high risk for osteoporosis. Talk with your health care provider about your test results, treatment options, and if necessary, the need for more tests. Follow these instructions at home: Eating and drinking  Eat a diet that includes fresh fruits and vegetables, whole grains, lean protein, and low-fat dairy products. Take vitamin and mineral supplements as recommended by your health care provider. Do not drink alcohol if: Your health care provider tells you not to drink. You are pregnant, may be pregnant, or are planning to become pregnant. If you drink alcohol: Limit how much you have to 0-1 drink a day. Be   aware of how much alcohol is in your drink. In the [place] one drink equals one 12 oz bottle of beer (355 mL), one 5 oz glass of wine (148 mL), or one 1 oz glass of hard liquor (44 mL). Lifestyle Take daily care of your teeth and  gums. Brush your teeth every morning and night with fluoride toothpaste. Floss one time each day. Stay active. Exercise for at least 30 minutes 5 or more days each week. Do not use any products that contain nicotine or tobacco, such as cigarettes, e-cigarettes, and chewing tobacco. If you need help quitting, ask your health care provider. Do not use drugs. If you are sexually active, practice safe sex. Use a condom or other form of protection to prevent STIs (sexually transmitted infections). If you do not wish to become pregnant, use a form of birth control. If you plan to become pregnant, see your health care provider for a prepregnancy visit. If told by your health care provider, take low-dose aspirin daily starting at age 63. Find healthy ways to cope with stress, such as: Meditation, yoga, or listening to music. Journaling. Talking to a trusted person. Spending time with friends and family. Safety Always wear your seat belt while driving or riding in a vehicle. Do not drive: If you have been drinking alcohol. Do not ride with someone who has been drinking. When you are tired or distracted. While texting. Wear a helmet and other protective equipment during sports activities. If you have firearms in your house, make sure you follow all gun safety procedures. What's next? Visit your health care provider once a year for an annual wellness visit. Ask your health care provider how often you should have your eyes and teeth checked. Stay up to date on all vaccines. This information is not intended to replace advice given to you by your health care provider. Make sure you discuss any questions you have with your health care provider. Document Revised: 06/05/2020 Document Reviewed: 12/07/2017 Elsevier Patient Education  2022 [employer].

## 2021-01-29 NOTE — Progress Notes (Signed)
Name: Macel Yearsley   MRN: [id number]    DOB: 11/17/1970   Date:01/29/2021       Progress Note  Subjective  Chief Complaint  Annual Exam  HPI  Patient presents for annual CPE.  The 10-year ASCVD risk score (Arnett DK, et al., 2019) is: 1.8%   Values used to calculate the score:     Age: 50 years     Sex: Female     Is Non-Hispanic African American: Yes     Diabetic: No     Tobacco smoker: No     Systolic Blood Pressure: 130 mmHg     Is BP treated: No     HDL Cholesterol: 76 mg/dL     Total Cholesterol: 213 mg/dL    Diet: she eats out because she does not cook, however she eats real food like fish, steak, salads, she snacks on fruit and eats yogurt , oatmeal or cereal for breakfast  Exercise: continue regular activity    [place] Office Visit from 05/05/2020 in [facility]  AUDIT-C Score 0      Depression: Phq 9 is  negative Depression screen [facility] 2/9 01/29/2021 11/03/2020 05/05/2020 12/09/2019 12/27/2017  Decreased Interest 0 0 0 0 0  Down, Depressed, Hopeless 0 0 1 0 0  PHQ - 2 Score 0 0 1 0 0  Altered sleeping 0 0 _0 Tired, decreased energy 0 0 0 1 2  Change in appetite 0 0 0 0 0  Feeling bad or failure about yourself  0 0 0 0 0  Trouble concentrating 0 0 0 0 0  Moving slowly or fidgety/restless 0 0 0 0 0  Suicidal thoughts 0 0 0 0 0  PHQ-9 Score 0 0 _1 Difficult doing work/chores - Not difficult at all Somewhat difficult Somewhat difficult Not difficult at all   Hypertension: BP Readings from Last 3 Encounters:  01/29/21 130/84  11/03/20 122/82  05/05/20 112/72   Obesity: Wt Readings from Last 3 Encounters:  01/29/21 150 lb (68 kg)  11/03/20 151 lb (68.5 kg)  05/05/20 139 lb 4.8 oz (63.2 kg)   BMI Readings from Last 3 Encounters:  01/29/21 24.21 kg/m  11/03/20 24.37 kg/m  05/05/20 22.48 kg/m     Vaccines:   Shingrix: first dose today  Pneumonia: educated and discussed with patient. Flu: educated and  discussed with patient.  Hep C Screening: 12/09/19 STD testing and prevention (HIV/chl/gon/syphilis): 11/03/20 Intimate partner violence: negative Sexual History : dating again, she already got checked for STI Menstrual History/LMP/Abnormal Bleeding: discussed post-menopausal bleeding  Incontinence Symptoms: no problems   Breast cancer:  - Last Mammogram: Ordered 06/03/20 - BRCA gene screening: N/A  Osteoporosis: Discussed high calcium and vitamin D supplementation, weight bearing exercises  Cervical cancer screening: 12/26/19 but has a new sexual partner   Skin cancer: Discussed monitoring for atypical lesions  Colorectal cancer: 02/28/20   Lung cancer: Low Dose CT Chest recommended if Age 69-80 years, 11 pack-year currently smoking OR have quit w/in 15years. Patient does not qualify.   ECG: N/A  Advanced Care Planning: A voluntary discussion about advance care planning including the explanation and discussion of advance directives.  Discussed health care proxy and Living will, and the patient was able to identify a health care proxy as  daughter   Lipids: Lab Results  Component Value Date   CHOL 213 (H) 11/03/2020   CHOL 193 12/09/2019   CHOL 210 (H) 12/27/2017  Lab Results  Component Value Date   HDL 76 11/03/2020   HDL 70 12/09/2019   HDL 70 12/27/2017   Lab Results  Component Value Date   LDLCALC 119 (H) 11/03/2020   LDLCALC 100 (H) 12/09/2019   LDLCALC 120 (H) 12/27/2017   Lab Results  Component Value Date   TRIG 79 11/03/2020   TRIG 129 12/09/2019   TRIG 94 12/27/2017   Lab Results  Component Value Date   CHOLHDL 2.8 11/03/2020   CHOLHDL 2.8 12/09/2019   CHOLHDL 3.0 12/27/2017   No results found for: LDLDIRECT  Glucose: Glucose, Bld  Date Value Ref Range Status  11/03/2020 85 65 - 99 mg/dL Final    Comment:    .            Fasting reference interval .   12/09/2019 84 65 - 99 mg/dL Final    Comment:    .            Fasting reference  interval .   11/29/2019 83 70 - 99 mg/dL Final    Comment:    Glucose reference range applies only to samples taken after fasting for at least 8 hours.    Patient Active Problem List   Diagnosis Date Noted   Encounter for screening colonoscopy    Knee pain, left 12/06/2019   Pre-diabetes 01/01/2018   Major depression in remission ([place]) 12/27/2017   Other neutropenia ([place]) 08/08/2016   Genital herpes 08/08/2016   GAD (generalized anxiety disorder) 11/10/2015   Migraine without aura and responsive to treatment 01/13/2015   Insomnia, persistent 01/13/2015   Night sweats 01/13/2015    Past Surgical History:  Procedure Laterality Date   CESAREAN SECTION     COLONOSCOPY WITH PROPOFOL N/A 02/28/2020   Procedure: COLONOSCOPY WITH PROPOFOL;  Surgeon: Lucilla Lame, MD;  Location: [facility];  Service: Endoscopy;  Laterality: N/A;  priority 4   culposcopy     TUBAL LIGATION      Family History  Problem Relation Age of Onset   Diabetes Father    Heart disease Father    Hypertension Father    Drug abuse Sister    Sickle cell trait Daughter    Breast cancer Neg Hx     Social History   Socioeconomic History   Marital status: Single    Spouse name: Not on file   Number of children: 2   Years of education: Not on file   Highest education level: Not on file  Occupational History   Occupation: postal service employee  Tobacco Use   Smoking status: Never   Smokeless tobacco: Never  Vaping Use   Vaping Use: Never used  Substance and Sexual Activity   Alcohol use: Yes    Alcohol/week: 0.0 standard drinks    Comment: occasionally (1x/month)   Drug use: No   Sexual activity: Yes    Birth control/protection: Condom    Comment: Tubal Ligation   Other Topics Concern   Not on file  Social History Narrative   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   She is living alone now.  She has grown children from previous relationship   Social Determinants of Adult nurse Strain: Low Risk    Difficulty of Paying Living Expenses: Not hard at all  Food Insecurity: No Food Insecurity   Worried About Charity fundraiser in the Last Year: Never true   [place] in the Last Year:  Never true  Transportation Needs: No Transportation Needs   Lack of Transportation (Medical): No   Lack of Transportation (Non-Medical): No  Physical Activity: Sufficiently Active   Days of Exercise per Week: 5 days   Minutes of Exercise per Session: 50 min  Stress: No Stress Concern Present   Feeling of Stress : Not at all  Social Connections: Moderately Isolated   Frequency of Communication with Friends and Family: More than three times a week   Frequency of Social Gatherings with Friends and Family: Twice a week   Attends Religious Services: More than 4 times per year   Active Member of [employer] or Organizations: No   Attends Archivist Meetings: Never   Marital Status: Never married  Human resources officer Violence: Not At Risk   Fear of Current or Ex-Partner: No   Emotionally Abused: No   Physically Abused: No   Sexually Abused: No     Current Outpatient Medications:    traZODone (DESYREL) 50 MG tablet, Take 0.5-1 tablets (25-50 mg total) by mouth at bedtime as needed for sleep., Disp: 90 tablet, Rfl: 0   valACYclovir (VALTREX) 500 MG tablet, TAKE 1 TABLET (500 MG TOTAL) BY MOUTH DAILY. TAKE 3 TIMES A DAY FOR OUTBREAKS, Disp: 35 tablet, Rfl: 1   Vitamin D, Ergocalciferol, (DRISDOL) 1.25 MG (50000 UNIT) CAPS capsule, Take 1 capsule (50,000 Units total) by mouth every 7 (seven) days., Disp: 12 capsule, Rfl: 1   SUMAtriptan (IMITREX) 100 MG tablet, Take 1 tablet (100 mg total) by mouth once for 1 dose., Disp: 10 tablet, Rfl: 2  Allergies  Allergen Reactions   Macrobid [Nitrofurantoin]     headache     ROS  Constitutional: Negative for fever or weight change.  Respiratory: Negative for cough and shortness of breath.   Cardiovascular:  Negative for chest pain or palpitations.  Gastrointestinal: Negative for abdominal pain, no bowel changes.  Musculoskeletal: Negative for gait problem or joint swelling.  Skin: Negative for rash.  Neurological: Negative for dizziness or headache.  No other specific complaints in a complete review of systems (except as listed in HPI above).   Objective  Vitals:   01/29/21 1411  BP: 130/84  Pulse: 87  Resp: 16  Temp: 98 F (36.7 C)  SpO2: 98%  Weight: 150 lb (68 kg)  Height: 5' 6" (1.676 m)    Body mass index is 24.21 kg/m.  Physical Exam  Constitutional: Patient appears well-developed and well-nourished. No distress.  HENT: Head: Normocephalic and atraumatic. Ears: B TMs ok, no erythema or effusion; Nose: Not done  Mouth/Throat: not done  Eyes: Conjunctivae and EOM are normal. Pupils are equal, round, and reactive to light. No scleral icterus.  Neck: Normal range of motion. Neck supple. No JVD present. No thyromegaly present.  Cardiovascular: Normal rate, regular rhythm and normal heart sounds.  No murmur heard. No BLE edema. Pulmonary/Chest: Effort normal and breath sounds normal. No respiratory distress. Abdominal: Soft. Bowel sounds are normal, no distension. There is no tenderness. no masses Breast: no lumps or masses, no nipple discharge or rashes FEMALE GENITALIA:  External genitalia normal External urethra small prolapse Vaginal vault normal without discharge or lesions Cervix normal without discharge or lesions - very small cervical polyp Bimanual exam normal without masses RECTAL: not done  Musculoskeletal: Normal range of motion, no joint effusions. No gross deformities Neurological: he is alert and oriented to person, place, and time. No cranial nerve deficit. Coordination, balance, strength, speech and gait are normal.  Skin: Skin is warm and dry. No rash noted. No erythema.  Psychiatric: Patient has a normal mood and affect. behavior is normal. Judgment and  thought content normal.   Recent Results (from the past 2160 hour(s))  Cervicovaginal ancillary only     Status: None   Collection Time: 11/03/20  4:03 PM  Result Value Ref Range   Neisseria Gonorrhea Negative    Chlamydia Negative    Comment Normal Reference Ranger Chlamydia - Negative    Comment      Normal Reference Range Neisseria Gonorrhea - Negative  COMPLETE METABOLIC PANEL WITH GFR     Status: None   Collection Time: 11/03/20  4:18 PM  Result Value Ref Range   Glucose, Bld 85 65 - 99 mg/dL    Comment: .            Fasting reference interval .    BUN 16 7 - 25 mg/dL   Creat 0.95 0.50 - 1.03 mg/dL   eGFR 72 > OR = 60 mL/min/1.66m    Comment: The eGFR is based on the CKD-EPI 2021 equation. To calculate  the new eGFR from a previous Creatinine or Cystatin C result, go to https://www.kidney.org/professionals/ kdoqi/gfr%5Fcalculator    BUN/Creatinine Ratio NOT APPLICABLE 6 - 22 (calc)   Sodium 138 135 - 146 mmol/L   Potassium 4.2 3.5 - 5.3 mmol/L   Chloride 104 98 - 110 mmol/L   CO2 30 20 - 32 mmol/L   Calcium 9.4 8.6 - 10.4 mg/dL   Total Protein 6.9 6.1 - 8.1 g/dL   Albumin 4.4 3.6 - 5.1 g/dL   Globulin 2.5 1.9 - 3.7 g/dL (calc)   AG Ratio 1.8 1.0 - 2.5 (calc)   Total Bilirubin 0.9 0.2 - 1.2 mg/dL   Alkaline phosphatase (APISO) 86 37 - 153 U/L   AST 18 10 - 35 U/L   ALT 17 6 - 29 U/L  CBC with Differential/Platelet     Status: Abnormal   Collection Time: 11/03/20  4:18 PM  Result Value Ref Range   WBC 2.9 (L) 3.8 - 10.8 Thousand/uL   RBC 4.40 3.80 - 5.10 Million/uL   Hemoglobin 13.2 11.7 - 15.5 g/dL   HCT 39.3 35.0 - 45.0 %   MCV 89.3 80.0 - 100.0 fL   MCH 30.0 27.0 - 33.0 pg   MCHC 33.6 32.0 - 36.0 g/dL   RDW 14.1 11.0 - 15.0 %   Platelets 170 140 - 400 Thousand/uL   MPV 12.5 7.5 - 12.5 fL   Neutro Abs 1,737 1,500 - 7,800 cells/uL   Lymphs Abs 751 (L) 850 - 3,900 cells/uL   Absolute Monocytes 310 200 - 950 cells/uL   Eosinophils Absolute 81 15 - 500  cells/uL   Basophils Absolute 20 0 - 200 cells/uL   Neutrophils Relative % 59.9 %   Total Lymphocyte 25.9 %   Monocytes Relative 10.7 %   Eosinophils Relative 2.8 %   Basophils Relative 0.7 %  Lipid panel     Status: Abnormal   Collection Time: 11/03/20  4:18 PM  Result Value Ref Range   Cholesterol 213 (H) <200 mg/dL   HDL 76 > OR = 50 mg/dL   Triglycerides 79 <150 mg/dL   LDL Cholesterol (Calc) 119 (H) mg/dL (calc)    Comment: Reference range: <100 . Desirable range <100 mg/dL for primary prevention;   <70 mg/dL for patients with CHD or diabetic patients  with > or = 2 CHD risk factors. .Marland Kitchen  LDL-C is now calculated using the Martin-Hopkins  calculation, which is a validated novel method providing  better accuracy than the Friedewald equation in the  estimation of LDL-C.  Cresenciano Genre et al. Annamaria Helling. 5916;384(66): 2061-2068  (http://education.[facility].com/faq/FAQ164)    Total CHOL/HDL Ratio 2.8 <5.0 (calc)   Non-HDL Cholesterol (Calc) 137 (H) <130 mg/dL (calc)    Comment: For patients with diabetes plus 1 major ASCVD risk  factor, treating to a non-HDL-C goal of <100 mg/dL  (LDL-C of <70 mg/dL) is considered a therapeutic  option.   Hemoglobin A1c     Status: None   Collection Time: 11/03/20  4:18 PM  Result Value Ref Range   Hgb A1c MFr Bld 5.5 <5.7 % of total Hgb    Comment: For the purpose of screening for the presence of diabetes: . <5.7%       Consistent with the absence of diabetes 5.7-6.4%    Consistent with increased risk for diabetes             (prediabetes) > or =6.5%  Consistent with diabetes . This assay result is consistent with a decreased risk of diabetes. . Currently, no consensus exists regarding use of hemoglobin A1c for diagnosis of diabetes in children. . According to American Diabetes Association (ADA) guidelines, hemoglobin A1c <7.0% represents optimal control in non-pregnant diabetic patients. Different metrics may apply to specific  patient populations.  Standards of Medical Care in Diabetes(ADA). .    Mean Plasma Glucose 111 mg/dL   eAG (mmol/L) 6.2 mmol/L  VITAMIN D 25 Hydroxy (Vit-D Deficiency, Fractures)     Status: Abnormal   Collection Time: 11/03/20  4:18 PM  Result Value Ref Range   Vit D, 25-Hydroxy 19 (L) 30 - 100 ng/mL    Comment: Vitamin D Status         25-OH Vitamin D: . Deficiency:                    <20 ng/mL Insufficiency:             20 - 29 ng/mL Optimal:                 > or = 30 ng/mL . For 25-OH Vitamin D testing on patients on  D2-supplementation and patients for whom quantitation  of D2 and D3 fractions is required, the QuestAssureD(TM) 25-OH VIT D, (D2,D3), LC/MS/MS is recommended: order  code [phone] (patients >81yr). See Note 1 . Note 1 . For additional information, please refer to  http://education.[facility].com/faq/FAQ199  (This link is being provided for informational/ educational purposes only.)   HIV Antibody (routine testing w rflx)     Status: None   Collection Time: 11/03/20  4:18 PM  Result Value Ref Range   HIV 1&2 Ab, 4th Generation NON-REACTIVE NON-REACTIVE    Comment: HIV-1 antigen and HIV-1/HIV-2 antibodies were not detected. There is no laboratory evidence of HIV infection. .Marland KitchenPLEASE NOTE: This information has been disclosed to you from records whose confidentiality may be protected by state law.  If your state requires such protection, then the state law prohibits you from making any further disclosure of the information without the specific written consent of the person to whom it pertains, or as otherwise permitted by law. A general authorization for the release of medical or other information is NOT sufficient for this purpose. . For additional information please refer to http://education.[facility].com/faq/FAQ106 (This link is being provided for informational/ educational purposes only.) . .Marland KitchenThe performance of  this assay has not been  clinically validated in patients less than 107 years old. .   RPR     Status: None   Collection Time: 11/03/20  4:18 PM  Result Value Ref Range   RPR Ser Ql NON-REACTIVE NON-REACTIVE     Fall Risk: Fall Risk  01/29/2021 11/03/2020 05/05/2020 12/09/2019 12/27/2017  Falls in the past year? 0 0 0 0 No  Number falls in past yr: 0 0 0 0 -  Injury with Fall? 0 0 0 0 -  Risk for fall due to : No Fall Risks - - - -  Follow up Falls prevention discussed - - - -     Functional Status Survey: Is the patient deaf or have difficulty hearing?: No Does the patient have difficulty seeing, even when wearing glasses/contacts?: No Does the patient have difficulty concentrating, remembering, or making decisions?: No Does the patient have difficulty walking or climbing stairs?: No Does the patient have difficulty dressing or bathing?: No Does the patient have difficulty doing errands alone such as visiting a doctor's office or shopping?: No   Assessment & Plan  1. Well adult exam   2. Needs flu shot  Not done  3. Need for shingles vaccine  - Varicella-zoster vaccine IM  4. Breast cancer screening by mammogram  - MM 3D SCREEN BREAST BILATERAL; Future  5. Cervical cancer screening  - Cytology - PAP ceriv   -USPSTF grade A and B recommendations reviewed with patient; age-appropriate recommendations, preventive care, screening tests, etc discussed and encouraged; healthy living encouraged; see AVS for patient education given to patient -Discussed importance of 150 minutes of physical activity weekly, eat two servings of fish weekly, eat one serving of tree nuts ( cashews, pistachios, pecans, almonds.Marland Kitchen) every other day, eat 6 servings of fruit/vegetables daily and drink plenty of water and avoid sweet beverages.

## 2021-02-03 LAB — CYTOLOGY - PAP
Comment: NEGATIVE
Diagnosis: NEGATIVE
High risk HPV: NEGATIVE

## 2021-02-23 DIAGNOSIS — B9689 Other specified bacterial agents as the cause of diseases classified elsewhere: Secondary | ICD-10-CM | POA: Diagnosis not present

## 2021-02-23 DIAGNOSIS — N760 Acute vaginitis: Secondary | ICD-10-CM | POA: Diagnosis not present

## 2021-02-24 NOTE — Progress Notes (Signed)
Name: Tammy Evans   MRN: [id number]    DOB: 08-Nov-1970   Date:02/25/2021       Progress Note  Subjective  Chief Complaint  Polyp  HPI  Post-menopausal bleeding: started about a few weeks ago, went to Urgent care and was advised to have cervical polyp removed. She is currently taking Metronidazole and diflucan, she has a new sexual partner and had STI screen on 11/15 , results not back yet. She has some pain during intercourse and post-coital bleeding .    Patient Active Problem List   Diagnosis Date Noted   Cervical polyp 01/29/2021   Encounter for screening colonoscopy    Knee pain, left 12/06/2019   Pre-diabetes 01/01/2018   Major depression in remission ([facility]) 12/27/2017   Other neutropenia ([facility]) 08/08/2016   Genital herpes 08/08/2016   GAD (generalized anxiety disorder) 11/10/2015   Migraine without aura and responsive to treatment 01/13/2015   Insomnia, persistent 01/13/2015   Night sweats 01/13/2015    Past Surgical History:  Procedure Laterality Date   CESAREAN SECTION     COLONOSCOPY WITH PROPOFOL N/A 02/28/2020   Procedure: COLONOSCOPY WITH PROPOFOL;  Surgeon: Midge Minium, MD;  [facility];  Service: Endoscopy;  Laterality: N/A;  priority 4   culposcopy     TUBAL LIGATION      Family History  Problem Relation Age of Onset   Diabetes Father    Heart disease Father    Hypertension Father    Drug abuse Sister    Sickle cell trait Daughter    Breast cancer Neg Hx     Social History   Tobacco Use   Smoking status: Never   Smokeless tobacco: Never  Substance Use Topics   Alcohol use: Yes    Alcohol/week: 0.0 standard drinks    Comment: occasionally (1x/month)     Current Outpatient Medications:    traZODone (DESYREL) 50 MG tablet, Take 0.5-1 tablets (25-50 mg total) by mouth at bedtime as needed for sleep., Disp: 90 tablet, Rfl: 0   valACYclovir (VALTREX) 500 MG tablet, TAKE 1 TABLET (500 MG TOTAL) BY MOUTH DAILY. TAKE 3 TIMES  A DAY FOR OUTBREAKS, Disp: 35 tablet, Rfl: 1   SUMAtriptan (IMITREX) 100 MG tablet, Take 1 tablet (100 mg total) by mouth once for 1 dose., Disp: 10 tablet, Rfl: 2   Vitamin D, Ergocalciferol, (DRISDOL) 1.25 MG (50000 UNIT) CAPS capsule, Take 1 capsule (50,000 Units total) by mouth every 7 (seven) days. (Patient not taking: Reported on 02/25/2021), Disp: 12 capsule, Rfl: 1  Allergies  Allergen Reactions   Macrobid [Nitrofurantoin]     headache    I personally reviewed active problem list, medication list, allergies, family history, social history, health maintenance with the patient/caregiver today.   ROS  Ten systems reviewed and is negative except as mentioned in HPI   Objective  Vitals:   02/25/21 0818  BP: 126/82  Pulse: 78  Resp: 16  Temp: 97.7 F (36.5 C)  SpO2: 99%  Weight: 154 lb (69.9 kg)  Height: 5\' 6"  (1.676 m)    Body mass index is 24.86 kg/m.  Physical Exam  Constitutional: Patient appears well-developed and well-nourished.  No distress.  HEENT: head atraumatic, normocephalic, pupils equal and reactive to light,neck supple, Cardiovascular: Normal rate, regular rhythm and normal heart sounds.  No murmur heard. No BLE edema. Pulmonary/Chest: Effort normal and breath sounds normal. No respiratory distress. Abdominal: Soft.  There is no tenderness. Pelvic: very small cervical polyp, removed  with a ring forceps, patient gave verbal consent and tolerated procedure well. Normal bimanual exam.  Psychiatric: Patient has a normal mood and affect. behavior is normal. Judgment and thought content normal.   Recent Results (from the past 2160 hour(s))  Cytology - PAP     Status: None   Collection Time: 01/29/21  3:00 PM  Result Value Ref Range   High risk HPV Negative    Adequacy      Satisfactory for evaluation; transformation zone component PRESENT.   Diagnosis      - Negative for intraepithelial lesion or malignancy (NILM)   Comment Normal Reference Range HPV -  Negative     PHQ2/9: Depression screen [facility] 2/9 02/25/2021 01/29/2021 11/03/2020 05/05/2020 12/09/2019  Decreased Interest 0 0 0 0 0  Down, Depressed, Hopeless 0 0 0 1 0  PHQ - 2 Score 0 0 0 1 0  Altered sleeping 0 0 0 2 2  Tired, decreased energy 0 0 0 0 1  Change in appetite 0 0 0 0 0  Feeling bad or failure about yourself  0 0 0 0 0  Trouble concentrating 0 0 0 0 0  Moving slowly or fidgety/restless 0 0 0 0 0  Suicidal thoughts 0 0 0 0 0  PHQ-9 Score 0 0 0 3 3  Difficult doing work/chores - - Not difficult at all Somewhat difficult Somewhat difficult  Some recent data might be hidden    phq 9 is negative   Fall Risk: Fall Risk  02/25/2021 01/29/2021 11/03/2020 05/05/2020 12/09/2019  Falls in the past year? 0 0 0 0 0  Number falls in past yr: 0 0 0 0 0  Injury with Fall? 0 0 0 0 0  Risk for fall due to : No Fall Risks No Fall Risks - - -  Follow up Falls prevention discussed Falls prevention discussed - - -      Functional Status Survey: Is the patient deaf or have difficulty hearing?: No Does the patient have difficulty seeing, even when wearing glasses/contacts?: No Does the patient have difficulty concentrating, remembering, or making decisions?: No Does the patient have difficulty walking or climbing stairs?: No Does the patient have difficulty dressing or bathing?: No Does the patient have difficulty doing errands alone such as visiting a doctor's office or shopping?: No    Assessment & Plan  1. Cervical polyp  - Surgical pathology  2. Post-menopausal bleeding  - US Pelvic Complete With Transvaginal; Future   Likely from polyp but we will get a pelvic US and if persists refer to gyn

## 2021-02-25 ENCOUNTER — Other Ambulatory Visit: Payer: Self-pay

## 2021-02-25 ENCOUNTER — Encounter: Payer: Self-pay | Admitting: Family Medicine

## 2021-02-25 ENCOUNTER — Ambulatory Visit: Payer: Federal, State, Local not specified - PPO | Admitting: Family Medicine

## 2021-02-25 ENCOUNTER — Other Ambulatory Visit (HOSPITAL_COMMUNITY)
Admission: RE | Admit: 2021-02-25 | Discharge: 2021-02-25 | Disposition: A | Discharge status: Home / Self Care | Payer: Federal, State, Local not specified - PPO | Source: Ambulatory Visit | Attending: Family Medicine | Admitting: Family Medicine

## 2021-02-25 VITALS — BP 126/82 | HR 78 | Temp 97.7°F | Resp 16 | Ht 66.0 in | Wt 154.0 lb

## 2021-02-25 DIAGNOSIS — N950 Postmenopausal bleeding: Secondary | ICD-10-CM

## 2021-02-25 DIAGNOSIS — N841 Polyp of cervix uteri: Secondary | ICD-10-CM | POA: Diagnosis not present

## 2021-02-26 LAB — SURGICAL PATHOLOGY

## 2021-03-12 DIAGNOSIS — Z20822 Contact with and (suspected) exposure to covid-19: Secondary | ICD-10-CM | POA: Diagnosis not present

## 2021-03-12 DIAGNOSIS — R059 Cough, unspecified: Secondary | ICD-10-CM | POA: Diagnosis not present

## 2021-03-12 DIAGNOSIS — J40 Bronchitis, not specified as acute or chronic: Secondary | ICD-10-CM | POA: Diagnosis not present

## 2021-03-16 ENCOUNTER — Other Ambulatory Visit (HOSPITAL_BASED_OUTPATIENT_CLINIC_OR_DEPARTMENT_OTHER): Payer: Self-pay | Admitting: Family Medicine

## 2021-03-16 DIAGNOSIS — N950 Postmenopausal bleeding: Secondary | ICD-10-CM

## 2021-05-04 ENCOUNTER — Other Ambulatory Visit: Payer: Self-pay

## 2021-05-04 ENCOUNTER — Ambulatory Visit
Admission: RE | Admit: 2021-05-04 | Discharge: 2021-05-04 | Disposition: A | Discharge status: Home / Self Care | Payer: Federal, State, Local not specified - PPO | Source: Ambulatory Visit | Attending: Family Medicine | Admitting: Family Medicine

## 2021-05-04 DIAGNOSIS — Z1231 Encounter for screening mammogram for malignant neoplasm of breast: Secondary | ICD-10-CM | POA: Insufficient documentation

## 2021-05-05 NOTE — Progress Notes (Signed)
Name: Tammy Evans   MRN: [id number]    DOB: July 07, 1970   Date:05/07/2021       Progress Note  Subjective  Chief Complaint  Follow Up  HPI  Menopause: she stopped having cycles since Feb 2021- she states hot flashes not as severe now. Discussed importance of follow up in case of post-menopausal bleeding.   Migraine headaches: She continues to have migraines intermittently. Certain things like different smells and lack of sleep trigger her migraines. Only two episodes since Oct 2022  Pre-diabetes: last A1C was at goal down from 5.7 % to 5.5 % . She  denies polyphagia, polydipsia or polyuria.   Vitamin D deficiency: taking rx vitamin D , we will recheck labs   Low white count: stable it goes up and down, used to have low platelets. We will recheck it today   Gaining weight: she has noticed weight gain no change in diet, she feels tired, no change in bowel movement . We will check TSH. 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   Patient Active Problem List   Diagnosis Date Noted   Cervical polyp 01/29/2021   Encounter for screening colonoscopy    Knee pain, left 12/06/2019   Pre-diabetes 01/01/2018   Major depression in remission ([place]) 12/27/2017   Other neutropenia ([place]) 08/08/2016   Genital herpes 08/08/2016   GAD (generalized anxiety disorder) 11/10/2015   Migraine without aura and responsive to treatment 01/13/2015   Insomnia, persistent 01/13/2015   Night sweats 01/13/2015    Past Surgical History:  Procedure Laterality Date   CESAREAN SECTION     COLONOSCOPY WITH PROPOFOL N/A 02/28/2020   Procedure: COLONOSCOPY WITH PROPOFOL;  Surgeon: Lucilla Lame, MD;  Location: [facility];  Service: Endoscopy;  Laterality: N/A;  priority 4   culposcopy     TUBAL LIGATION      Family  History  Problem Relation Age of Onset   Diabetes Father    Heart disease Father    Hypertension Father    Drug abuse Sister    Sickle cell trait Daughter    Breast cancer Neg Hx     Social History   Tobacco Use   Smoking status: Never   Smokeless tobacco: Never  Substance Use Topics   Alcohol use: Yes    Alcohol/week: 0.0 standard drinks    Comment: occasionally (1x/month)     Current Outpatient Medications:    traZODone (DESYREL) 50 MG tablet, Take 0.5-1 tablets (25-50 mg total) by mouth at bedtime as needed for sleep., Disp: 90 tablet, Rfl: 0   valACYclovir (VALTREX) 500 MG tablet, TAKE 1 TABLET (500 MG TOTAL) BY MOUTH DAILY. TAKE 3 TIMES A DAY FOR OUTBREAKS, Disp: 35 tablet, Rfl: 1   SUMAtriptan (IMITREX) 100 MG tablet, Take 1 tablet (100 mg total) by mouth once for 1 dose., Disp: 10 tablet, Rfl: 2  Allergies  Allergen Reactions   Macrobid [Nitrofurantoin]     headache    I personally reviewed active problem list, medication list, allergies, family history, social history, health maintenance with the patient/caregiver today.   ROS  Constitutional: Negative for fever or weight change.  Respiratory: Negative for cough and shortness of breath.   Cardiovascular: Negative for chest pain or palpitations.  Gastrointestinal: Negative for abdominal  pain, no bowel changes.  Musculoskeletal: Negative for gait problem or joint swelling.  Skin: Negative for rash.  Neurological: Negative for dizziness or headache.  No other specific complaints in a complete review of systems (except as listed in HPI above).   Objective  Vitals:   05/07/21 1520  BP: 122/74  Pulse: 84  Resp: 16  Temp: 97.7 F (36.5 C)  TempSrc: Oral  SpO2: 98%  Weight: 156 lb (70.8 kg)  Height: 5\' 6"  (1.676 m)    Body mass index is 25.18 kg/m.  Physical Exam  Constitutional: Patient appears well-developed and well-nourished.  No distress.  HEENT: head atraumatic, normocephalic, pupils equal and  reactive to light,  neck supple Cardiovascular: Normal rate, regular rhythm and normal heart sounds.  No murmur heard. No BLE edema. Pulmonary/Chest: Effort normal and breath sounds normal. No respiratory distress. Abdominal: Soft.  There is no tenderness. Psychiatric: Patient has a normal mood and affect. behavior is normal. Judgment and thought content normal.   Recent Results (from the past 2160 hour(s))  Surgical pathology     Status: None   Collection Time: 02/25/21  8:45 AM  Result Value Ref Range   SURGICAL PATHOLOGY      SURGICAL PATHOLOGY CASE: [id number] PATIENT: [facility] Surgical Pathology Report     Clinical History: cervical polyp, PMB over past 3 weeks (cm)     FINAL MICROSCOPIC DIAGNOSIS:  A. CERVIX, POLYPECTOMY: Strips of benign squamous epithelium mixed with neutrophils and mucus consistent with contents of a cyst in the uterine cervix. Negative for SIL and glandular neoplasia.   GROSS DESCRIPTION:  The specimen is received in formalin and consists of a scant amount of tan-white soft tissue.  A cell block is made.  Craig Staggers 02/25/2021)    Final Diagnosis performed by Unknown Jim, MD.   Electronically signed 02/26/2021 Technical and / or Professional components performed at [facility]. [facility] [address] [place], [place] [address].  Immunohistochemistry Technical component (if applicable) was performed at [facility]. [address] [place], [place], [place] [address].   IMMUNOHISTOCHEMISTRY DISCLAIMER (if applicable): S ome of these immunohistochemical stains may have been developed and the performance characteristics determine by [facility]. Some may not have been cleared or approved by the U.S. Food and Drug Administration. The FDA has determined that such clearance or approval is not necessary. This test is used for clinical purposes. It should not be regarded as investigational or for  research. This laboratory is certified under the [place] (CLIA-88) as qualified to perform high complexity clinical laboratory testing.  The controls stained appropriately.     PHQ2/9: Depression screen [facility] 2/9 05/07/2021 02/25/2021 01/29/2021 11/03/2020 05/05/2020  Decreased Interest 0 0 0 0 0  Down, Depressed, Hopeless 0 0 0 0 1  PHQ - 2 Score 0 0 0 0 1  Altered sleeping 0 0 0 0 2  Tired, decreased energy 0 0 0 0 0  Change in appetite 0 0 0 0 0  Feeling bad or failure about yourself  0 0 0 0 0  Trouble concentrating 0 0 0 0 0  Moving slowly or fidgety/restless 0 0 0 0 0  Suicidal thoughts 0 0 0 0 0  PHQ-9 Score 0 0 0 0 3  Difficult doing work/chores Not difficult at all - - Not difficult at all Somewhat difficult  Some recent data might be hidden    phq 9 is negative  Fall Risk: Fall Risk  05/07/2021 02/25/2021 01/29/2021 11/03/2020 05/05/2020  Falls in the past year? 0 0 0 0 0  Number falls in past yr: 0 0 0 0 0  Injury with Fall? 0 0 0 0 0  Risk for fall due to : No Fall Risks No Fall Risks No Fall Risks - -  Follow up Falls prevention discussed Falls prevention discussed Falls prevention discussed - -      Functional Status Survey: Is the patient deaf or have difficulty hearing?: No Does the patient have difficulty seeing, even when wearing glasses/contacts?: No Does the patient have difficulty concentrating, remembering, or making decisions?: No Does the patient have difficulty walking or climbing stairs?: No Does the patient have difficulty dressing or bathing?: No Does the patient have difficulty doing errands alone such as visiting a doctor's office or shopping?: No    Assessment & Plan  1. Other neutropenia ([place])  - CBC with Differential/Platelet  2. Vitamin D deficiency  - VITAMIN D 25 Hydroxy (Vit-D Deficiency, Fractures)  3. Migraine without aura and responsive to treatment   4. Pre-diabetes  - Hemoglobin  A1c  5. Need for shingles vaccine  - Varicella-zoster vaccine IM (Shingrix)  6. Weight gain  - CBC with Differential/Platelet - TSH - COMPLETE METABOLIC PANEL WITH GFR  7. Insomnia, unspecified type  - traZODone (DESYREL) 50 MG tablet; Take 1 tablet (50 mg total) by mouth at bedtime as needed for sleep.  Dispense: 90 tablet; Refill: 1

## 2021-05-07 ENCOUNTER — Ambulatory Visit: Payer: Federal, State, Local not specified - PPO | Admitting: Family Medicine

## 2021-05-07 ENCOUNTER — Encounter: Payer: Self-pay | Admitting: Family Medicine

## 2021-05-07 VITALS — BP 122/74 | HR 84 | Temp 97.7°F | Resp 16 | Ht 66.0 in | Wt 156.0 lb

## 2021-05-07 DIAGNOSIS — Z23 Encounter for immunization: Secondary | ICD-10-CM

## 2021-05-07 DIAGNOSIS — G4700 Insomnia, unspecified: Secondary | ICD-10-CM

## 2021-05-07 DIAGNOSIS — E559 Vitamin D deficiency, unspecified: Secondary | ICD-10-CM | POA: Diagnosis not present

## 2021-05-07 DIAGNOSIS — G43009 Migraine without aura, not intractable, without status migrainosus: Secondary | ICD-10-CM

## 2021-05-07 DIAGNOSIS — D708 Other neutropenia: Secondary | ICD-10-CM

## 2021-05-07 DIAGNOSIS — R7303 Prediabetes: Secondary | ICD-10-CM | POA: Diagnosis not present

## 2021-05-07 DIAGNOSIS — R635 Abnormal weight gain: Secondary | ICD-10-CM

## 2021-05-07 MED ORDER — TRAZODONE HCL 50 MG PO TABS: 50.0000 mg | ORAL_TABLET | Freq: Every evening | ORAL | 1 refills | Status: DC | PRN

## 2021-05-08 LAB — COMPLETE METABOLIC PANEL WITH GFR
AG Ratio: 1.7 (calc) (ref 1.0–2.5)
ALT: 24 U/L (ref 6–29)
AST: 22 U/L (ref 10–35)
Albumin: 4.4 g/dL (ref 3.6–5.1)
Alkaline phosphatase (APISO): 99 U/L (ref 37–153)
BUN: 13 mg/dL (ref 7–25)
CO2: 30 mmol/L (ref 20–32)
Calcium: 9.5 mg/dL (ref 8.6–10.4)
Chloride: 104 mmol/L (ref 98–110)
Creat: 0.98 mg/dL (ref 0.50–1.03)
Globulin: 2.6 g/dL (calc) (ref 1.9–3.7)
Glucose, Bld: 83 mg/dL (ref 65–99)
Potassium: 4.2 mmol/L (ref 3.5–5.3)
Sodium: 140 mmol/L (ref 135–146)
Total Bilirubin: 1.5 mg/dL — ABNORMAL HIGH (ref 0.2–1.2)
Total Protein: 7 g/dL (ref 6.1–8.1)
eGFR: 69 mL/min/{1.73_m2} (ref >=60)

## 2021-05-08 LAB — CBC WITH DIFFERENTIAL/PLATELET
Absolute Monocytes: 380 cells/uL (ref 200–950)
Basophils Absolute: 10 cells/uL (ref 0–200)
Basophils Relative: 0.3 %
Eosinophils Absolute: 59 cells/uL (ref 15–500)
Eosinophils Relative: 1.8 %
HCT: 40.8 % (ref 35.0–45.0)
Hemoglobin: 13.6 g/dL (ref 11.7–15.5)
Lymphs Abs: 818 cells/uL — ABNORMAL LOW (ref 850–3900)
MCH: 30 pg (ref 27.0–33.0)
MCHC: 33.3 g/dL (ref 32.0–36.0)
MCV: 89.9 fL (ref 80.0–100.0)
MPV: 13.5 fL — ABNORMAL HIGH (ref 7.5–12.5)
Monocytes Relative: 11.5 %
Neutro Abs: 2033 cells/uL (ref 1500–7800)
Neutrophils Relative %: 61.6 %
Platelets: 143 10*3/uL (ref 140–400)
RBC: 4.54 10*6/uL (ref 3.80–5.10)
RDW: 13.7 % (ref 11.0–15.0)
Total Lymphocyte: 24.8 %
WBC: 3.3 10*3/uL — ABNORMAL LOW (ref 3.8–10.8)

## 2021-05-08 LAB — HEMOGLOBIN A1C
Hgb A1c MFr Bld: 5.7 % of total Hgb — ABNORMAL HIGH (ref <5.7)
Mean Plasma Glucose: 117 mg/dL
eAG (mmol/L): 6.5 mmol/L

## 2021-05-08 LAB — VITAMIN D 25 HYDROXY (VIT D DEFICIENCY, FRACTURES): Vit D, 25-Hydroxy: 14 ng/mL — ABNORMAL LOW (ref 30–100)

## 2021-05-08 LAB — TSH: TSH: 0.58 mIU/L

## 2021-05-10 ENCOUNTER — Other Ambulatory Visit: Payer: Self-pay | Admitting: Family Medicine

## 2021-05-10 DIAGNOSIS — E559 Vitamin D deficiency, unspecified: Secondary | ICD-10-CM

## 2021-05-10 MED ORDER — VITAMIN D (ERGOCALCIFEROL) 1.25 MG (50000 UNIT) PO CAPS: 50000.0000 [IU] | ORAL_CAPSULE | ORAL | 1 refills | Status: DC

## 2021-11-04 NOTE — Progress Notes (Deleted)
Name: Tammy Evans   MRN: [id number]    DOB: 05/09/1970   Date:11/04/2021       Progress Note  Subjective  Chief Complaint  Follow Up  HPI  Menopause: she stopped having cycles since Feb 2021- she states hot flashes not as severe now. Discussed importance of follow up in case of post-menopausal bleeding.   Migraine headaches: She continues to have migraines intermittently. Certain things like different smells and lack of sleep trigger her migraines. Only two episodes since Oct 2022  Pre-diabetes: last A1C was at goal down from 5.7 % to 5.5 % . She  denies polyphagia, polydipsia or polyuria.   Vitamin D deficiency: taking rx vitamin D , we will recheck labs   Low white count: stable it goes up and down, used to have low platelets. We will recheck it today   Gaining weight: she has noticed weight gain no change in diet, she feels tired, no change in bowel movement . We will check TSH. 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   Patient Active Problem List   Diagnosis Date Noted   Cervical polyp 01/29/2021   Encounter for screening colonoscopy    Knee pain, left 12/06/2019   Pre-diabetes 01/01/2018   Major depression in remission ([facility]) 12/27/2017   Other neutropenia ([facility]) 08/08/2016   Genital herpes 08/08/2016   GAD (generalized anxiety disorder) 11/10/2015   Migraine without aura and responsive to treatment 01/13/2015   Insomnia, persistent 01/13/2015   Night sweats 01/13/2015    Past Surgical History:  Procedure Laterality Date   CESAREAN SECTION     COLONOSCOPY WITH PROPOFOL N/A 02/28/2020   Procedure: COLONOSCOPY WITH PROPOFOL;  Surgeon: Midge Minium, MD;  Location: [facility];  Service: Endoscopy;  Laterality: N/A;  priority 4   culposcopy     TUBAL LIGATION      Family  History  Problem Relation Age of Onset   Diabetes Father    Heart disease Father    Hypertension Father    Drug abuse Sister    Sickle cell trait Daughter    Breast cancer Neg Hx     Social History   Tobacco Use   Smoking status: Never   Smokeless tobacco: Never  Substance Use Topics   Alcohol use: Yes    Alcohol/week: 0.0 standard drinks of alcohol    Comment: occasionally (1x/month)     Current Outpatient Medications:    SUMAtriptan (IMITREX) 100 MG tablet, Take 1 tablet (100 mg total) by mouth once for 1 dose., Disp: 10 tablet, Rfl: 2   traZODone (DESYREL) 50 MG tablet, Take 1 tablet (50 mg total) by mouth at bedtime as needed for sleep., Disp: 90 tablet, Rfl: 1   valACYclovir (VALTREX) 500 MG tablet, TAKE 1 TABLET (500 MG TOTAL) BY MOUTH DAILY. TAKE 3 TIMES A DAY FOR OUTBREAKS, Disp: 35 tablet, Rfl: 1   Vitamin D, Ergocalciferol, (DRISDOL) 1.25 MG (50000 UNIT) CAPS capsule, Take 1 capsule (50,000 Units total) by mouth every 7 (seven) days., Disp: 12 capsule, Rfl: 1  Allergies  Allergen Reactions   Macrobid [Nitrofurantoin]     headache    I personally reviewed active problem list, medication list, allergies, family history, social history, health maintenance with the patient/caregiver today.   ROS  ***  Objective  There were no vitals filed for this visit.  There is no height or weight on file to calculate BMI.  Physical Exam ***  No results found for this or any previous visit (from the past 2160 hour(s)).   PHQ2/9:    05/07/2021    3:10 PM 02/25/2021    8:17 AM 01/29/2021    2:10 PM 11/03/2020    3:43 PM 05/05/2020    3:18 PM  Depression screen PHQ 2/9  Decreased Interest 0 0 0 0 0  Down, Depressed, Hopeless 0 0 0 0 1  PHQ - 2 Score 0 0 0 0 1  Altered sleeping 0 0 0 0 2  Tired, decreased energy 0 0 0 0 0  Change in appetite 0 0 0 0 0  Feeling bad or failure about yourself  0 0 0 0 0  Trouble concentrating 0 0 0 0 0  Moving slowly or  fidgety/restless 0 0 0 0 0  Suicidal thoughts 0 0 0 0 0  PHQ-9 Score 0 0 0 0 3  Difficult doing work/chores Not difficult at all   Not difficult at all Somewhat difficult    phq 9 is {gen pos [id number]}   Fall Risk:    05/07/2021    3:10 PM 02/25/2021    8:17 AM 01/29/2021    2:10 PM 11/03/2020    3:43 PM 05/05/2020    3:17 PM  Fall Risk   Falls in the past year? 0 0 0 0 0  Number falls in past yr: 0 0 0 0 0  Injury with Fall? 0 0 0 0 0  Risk for fall due to : No Fall Risks No Fall Risks No Fall Risks    Follow up Falls prevention discussed Falls prevention discussed Falls prevention discussed        Functional Status Survey:      Assessment & Plan  *** There are no diagnoses linked to this encounter.

## 2021-11-05 ENCOUNTER — Ambulatory Visit: Payer: Federal, State, Local not specified - PPO | Admitting: Family Medicine

## 2021-12-06 DIAGNOSIS — N760 Acute vaginitis: Secondary | ICD-10-CM | POA: Diagnosis not present

## 2021-12-07 DIAGNOSIS — K080 Exfoliation of teeth due to systemic causes: Secondary | ICD-10-CM | POA: Diagnosis not present

## 2021-12-07 NOTE — Progress Notes (Unsigned)
Name: Tammy Evans   MRN: [id number]    DOB: Jul 31, 1970   Date:12/08/2021       Progress Note  Subjective  Chief Complaint  Follow Up  HPI  Menopause: she stopped having cycles since Feb 2021- she states hot flashes and night sweats are mild now , does not want to take any medication   Migraine headaches: She continues to have migraines intermittently. Certain things like different smells and lack of sleep trigger her migraines. She works third shift . Episodes are now are about once a month, she takes imitrex and takes a nap, she states does not want to try another medication at this time   Pre-diabetes: last A1C was 5.7% last visit, she likes sweets and has difficulty cutting on carbohydrates. She is physically active   Vitamin D deficiency: she states that she forgets to take medication once a week, we will change to vitamin D otc 2000 units daily   Low white count: stable it goes up and down, used to have low platelets but that has resolved. She states has intermittent bruising on her legs when she bumps into something, not spontaneously   Insomnia due to working 3rd shift doing well on trazodone  Herpes genitalis: she needs a refill of valtrex, she only takes it prn, she has outbreaks about twice a year, last episode about 3 months ago. Discussed daily medication to decrease rate of transmission.   Patient Active Problem List   Diagnosis Date Noted   Menopausal vasomotor syndrome 12/08/2021   Cervical polyp 01/29/2021   Knee pain, left 12/06/2019   Pre-diabetes 01/01/2018   Major depression in remission ([facility]) 12/27/2017   Other neutropenia ([facility]) 08/08/2016   Genital herpes 08/08/2016   GAD (generalized anxiety disorder) 11/10/2015   Migraine without aura and responsive to treatment 01/13/2015   Insomnia, persistent 01/13/2015    Past Surgical History:  Procedure Laterality Date   CESAREAN SECTION     COLONOSCOPY WITH PROPOFOL N/A 02/28/2020   Procedure:  COLONOSCOPY WITH PROPOFOL;  Surgeon: Midge Minium, MD;  Location: [facility];  Service: Endoscopy;  Laterality: N/A;  priority 4   culposcopy     TUBAL LIGATION      Family History  Problem Relation Age of Onset   Diabetes Father    Heart disease Father    Hypertension Father    Drug abuse Sister    Sickle cell trait Daughter    Breast cancer Neg Hx     Social History   Tobacco Use   Smoking status: Never   Smokeless tobacco: Never  Substance Use Topics   Alcohol use: Yes    Alcohol/week: 0.0 standard drinks of alcohol    Comment: occasionally (1x/month)     Current Outpatient Medications:    metroNIDAZOLE (FLAGYL) 500 MG tablet, Take 500 mg by mouth 2 (two) times daily., Disp: , Rfl:    Cholecalciferol (VITAMIN D3) 50 MCG (2000 UT) capsule, Take 1 capsule (2,000 Units total) by mouth daily., Disp: 100 capsule, Rfl: 1   SUMAtriptan (IMITREX) 100 MG tablet, Take 1 tablet (100 mg total) by mouth once for 1 dose., Disp: 10 tablet, Rfl: 2   traZODone (DESYREL) 50 MG tablet, Take 1 tablet (50 mg total) by mouth at bedtime as needed for sleep., Disp: 90 tablet, Rfl: 1   valACYclovir (VALTREX) 500 MG tablet, Take 1 tablet (500 mg total) by mouth daily. TAKE 1 TABLET (500 MG TOTAL) BY MOUTH DAILY. TAKE 3 TIMES A DAY  FOR OUTBREAKS, Disp: 110 tablet, Rfl: 1  Allergies  Allergen Reactions   Macrobid [Nitrofurantoin]     headache    I personally reviewed active problem list, medication list, allergies, family history, social history, health maintenance with the patient/caregiver today.   ROS  Constitutional: Negative for fever or weight change.  Respiratory: Negative for cough and shortness of breath.   Cardiovascular: Negative for chest pain or palpitations.  Gastrointestinal: Negative for abdominal pain, no bowel changes.  Musculoskeletal: Negative for gait problem or joint swelling.  Skin: Negative for rash.  Neurological: Negative for dizziness or headache.  No  other specific complaints in a complete review of systems (except as listed in HPI above).   Objective  Vitals:   12/08/21 1329  BP: 118/74  Pulse: 76  Resp: 16  Temp: 97.9 F (36.6 C)  TempSrc: Oral  SpO2: 98%  Weight: 154 lb 1.6 oz (69.9 kg)  Height: 5\' 6"  (1.676 m)    Body mass index is 24.87 kg/m.  Physical Exam  Constitutional: Patient appears well-developed and well-nourished.  No distress.  HEENT: head atraumatic, normocephalic, pupils equal and reactive to light, neck supple Cardiovascular: Normal rate, regular rhythm and normal heart sounds.  No murmur heard. No BLE edema. Pulmonary/Chest: Effort normal and breath sounds normal. No respiratory distress. Abdominal: Soft.  There is no tenderness. Psychiatric: Patient has a normal mood and affect. behavior is normal. Judgment and thought content normal.   PHQ2/9:    12/08/2021    1:23 PM 05/07/2021    3:10 PM 02/25/2021    8:17 AM 01/29/2021    2:10 PM 11/03/2020    3:43 PM  Depression screen PHQ 2/9  Decreased Interest 0 0 0 0 0  Down, Depressed, Hopeless 0 0 0 0 0  PHQ - 2 Score 0 0 0 0 0  Altered sleeping 0 0 0 0 0  Tired, decreased energy 0 0 0 0 0  Change in appetite 0 0 0 0 0  Feeling bad or failure about yourself  0 0 0 0 0  Trouble concentrating 0 0 0 0 0  Moving slowly or fidgety/restless 0 0 0 0 0  Suicidal thoughts 0 0 0 0 0  PHQ-9 Score 0 0 0 0 0  Difficult doing work/chores Not difficult at all Not difficult at all   Not difficult at all    phq 9 is negative   Fall Risk:    12/08/2021    1:23 PM 05/07/2021    3:10 PM 02/25/2021    8:17 AM 01/29/2021    2:10 PM 11/03/2020    3:43 PM  Fall Risk   Falls in the past year? 0 0 0 0 0  Number falls in past yr: 0 0 0 0 0  Injury with Fall? 0 0 0 0 0  Risk for fall due to : No Fall Risks No Fall Risks No Fall Risks No Fall Risks   Follow up Falls prevention discussed;Education provided Falls prevention discussed Falls prevention discussed Falls  prevention discussed       Functional Status Survey: Is the patient deaf or have difficulty hearing?: No Does the patient have difficulty seeing, even when wearing glasses/contacts?: No Does the patient have difficulty concentrating, remembering, or making decisions?: No Does the patient have difficulty walking or climbing stairs?: No Does the patient have difficulty dressing or bathing?: No Does the patient have difficulty doing errands alone such as visiting a doctor's office or shopping?: No  Assessment & Plan  1. Other neutropenia (HCC)  We will recheck next visit   2. Migraine without aura and responsive to treatment  - SUMAtriptan (IMITREX) 100 MG tablet; Take 1 tablet (100 mg total) by mouth once for 1 dose.  Dispense: 10 tablet; Refill: 2  3. Pre-diabetes   4. Vitamin D deficiency  - Cholecalciferol (VITAMIN D3) 50 MCG (2000 UT) capsule; Take 1 capsule (2,000 Units total) by mouth daily.  Dispense: 100 capsule; Refill: 1  5. Other insomnia  - traZODone (DESYREL) 50 MG tablet; Take 1 tablet (50 mg total) by mouth at bedtime as needed for sleep.  Dispense: 90 tablet; Refill: 1  6. Genital herpes simplex, unspecified site  - valACYclovir (VALTREX) 500 MG tablet; Take 1 tablet (500 mg total) by mouth daily. TAKE 1 TABLET (500 MG TOTAL) BY MOUTH DAILY. TAKE 3 TIMES A DAY FOR OUTBREAKS  Dispense: 110 tablet; Refill: 1  7. Menopausal vasomotor syndrome

## 2021-12-08 ENCOUNTER — Encounter: Payer: Self-pay | Admitting: Family Medicine

## 2021-12-08 ENCOUNTER — Ambulatory Visit: Payer: Federal, State, Local not specified - PPO | Admitting: Family Medicine

## 2021-12-08 VITALS — BP 118/74 | HR 76 | Temp 97.9°F | Resp 16 | Ht 66.0 in | Wt 154.1 lb

## 2021-12-08 DIAGNOSIS — E559 Vitamin D deficiency, unspecified: Secondary | ICD-10-CM

## 2021-12-08 DIAGNOSIS — G4709 Other insomnia: Secondary | ICD-10-CM

## 2021-12-08 DIAGNOSIS — A6000 Herpesviral infection of urogenital system, unspecified: Secondary | ICD-10-CM

## 2021-12-08 DIAGNOSIS — R7303 Prediabetes: Secondary | ICD-10-CM | POA: Diagnosis not present

## 2021-12-08 DIAGNOSIS — N951 Menopausal and female climacteric states: Secondary | ICD-10-CM | POA: Insufficient documentation

## 2021-12-08 DIAGNOSIS — G43009 Migraine without aura, not intractable, without status migrainosus: Secondary | ICD-10-CM | POA: Diagnosis not present

## 2021-12-08 DIAGNOSIS — D708 Other neutropenia: Secondary | ICD-10-CM

## 2021-12-08 MED ORDER — TRAZODONE HCL 50 MG PO TABS: 50.0000 mg | ORAL_TABLET | Freq: Every evening | ORAL | 1 refills | Status: DC | PRN

## 2021-12-08 MED ORDER — SUMATRIPTAN SUCCINATE 100 MG PO TABS: 100.0000 mg | ORAL_TABLET | Freq: Once | ORAL | 2 refills | Status: DC

## 2021-12-08 MED ORDER — VALACYCLOVIR HCL 500 MG PO TABS: 500.0000 mg | ORAL_TABLET | Freq: Every day | ORAL | 1 refills | Status: DC

## 2021-12-08 MED ORDER — VITAMIN D3 50 MCG (2000 UT) PO CAPS: 2000.0000 [IU] | ORAL_CAPSULE | Freq: Every day | ORAL | 1 refills | Status: AC

## 2022-01-26 DIAGNOSIS — K080 Exfoliation of teeth due to systemic causes: Secondary | ICD-10-CM | POA: Diagnosis not present

## 2022-01-28 NOTE — Progress Notes (Unsigned)
Name: Tammy Evans   MRN: [id number]    DOB: December 08, 1970   Date:01/31/2022       Progress Note  Subjective  Chief Complaint  Annual Exam  HPI  Patient presents for annual CPE.  Diet: she does not cook much at home but she choose healthy options when she goes out. Usually salads, likes salmon, shrimp  Yougurt for breakfast and protein shakes Exercise: continue regular physical activity   Last Eye Exam: once a year, and due for follow up Last Dental Exam: last week, regular cleaning   [place] [facility] Visit from 01/31/2022 in [facility]  AUDIT-C Score 1      Depression: Phq 9 is  negative    01/31/2022    3:41 PM 12/08/2021    1:23 PM 05/07/2021    3:10 PM 02/25/2021    8:17 AM 01/29/2021    2:10 PM  Depression screen PHQ 2/9  Decreased Interest 0 0 0 0 0  Down, Depressed, Hopeless 0 0 0 0 0  PHQ - 2 Score 0 0 0 0 0  Altered sleeping 0 0 0 0 0  Tired, decreased energy 0 0 0 0 0  Change in appetite 0 0 0 0 0  Feeling bad or failure about yourself  0 0 0 0 0  Trouble concentrating 0 0 0 0 0  Moving slowly or fidgety/restless 0 0 0 0 0  Suicidal thoughts 0 0 0 0 0  PHQ-9 Score 0 0 0 0 0  Difficult doing work/chores  Not difficult at all Not difficult at all     Hypertension: BP Readings from Last 3 Encounters:  01/31/22 116/72  12/08/21 118/74  05/07/21 122/74   Obesity: Wt Readings from Last 3 Encounters:  01/31/22 152 lb 6.4 oz (69.1 kg)  12/08/21 154 lb 1.6 oz (69.9 kg)  05/07/21 156 lb (70.8 kg)   BMI Readings from Last 3 Encounters:  01/31/22 25.36 kg/m  12/08/21 24.87 kg/m  05/07/21 25.18 kg/m     Vaccines:   Tdap: up to date Shingrix: up to date Pneumonia: N/A Flu: Never COVID-19: 10/01/19 J&J, discussed booster    Hep C Screening: 12/09/19 STD testing and prevention (HIV/chl/gon/syphilis): 11/03/20 - she states got checked again this Summer after she broke up with partner  Intimate partner violence: negative  screen  Sexual History : she is currently single, she had a partner for 10 years but glad it is over since July 2023 Menstrual History/LMP/Abnormal Bleeding: post-menopausal Discussed importance of follow up if any post-menopausal bleeding: yes  Incontinence Symptoms: negative for symptoms   Breast cancer:  - Last Mammogram: 05/04/21 - BRCA gene screening: N/A  Osteoporosis Prevention : Discussed high calcium and vitamin D supplementation, weight bearing exercises Bone density: N/A   Cervical cancer screening: 01/29/21  Skin cancer: Discussed monitoring for atypical lesions  Colorectal cancer: 02/28/20   Lung cancer:  Low Dose CT Chest recommended if Age 41-80 years, 20 pack-year currently smoking OR have quit w/in 15years. Patient does not qualify for screen   ECG: N/A  Advanced Care Planning: A voluntary discussion about advance care planning including the explanation and discussion of advance directives.  Discussed health care proxy and Living will, and the patient was able to identify a health care proxy as daughter Bonner Puna .  Patient does not have a living will and power of attorney of health care   Lipids: Lab Results  Component Value Date   CHOL 213 (H) 11/03/2020  CHOL 193 12/09/2019   CHOL 210 (H) 12/27/2017   Lab Results  Component Value Date   HDL 76 11/03/2020   HDL 70 12/09/2019   HDL 70 12/27/2017   Lab Results  Component Value Date   LDLCALC 119 (H) 11/03/2020   LDLCALC 100 (H) 12/09/2019   LDLCALC 120 (H) 12/27/2017   Lab Results  Component Value Date   TRIG 79 11/03/2020   TRIG 129 12/09/2019   TRIG 94 12/27/2017   Lab Results  Component Value Date   CHOLHDL 2.8 11/03/2020   CHOLHDL 2.8 12/09/2019   CHOLHDL 3.0 12/27/2017   No results found for: "LDLDIRECT"  Glucose: Glucose, Bld  Date Value Ref Range Status  05/07/2021 83 65 - 99 mg/dL Final    Comment:    .            Fasting reference interval .   11/03/2020 85 65 - 99 mg/dL Final     Comment:    .            Fasting reference interval .   12/09/2019 84 65 - 99 mg/dL Final    Comment:    .            Fasting reference interval .     Patient Active Problem List   Diagnosis Date Noted   Menopausal vasomotor syndrome 12/08/2021   Vitamin D deficiency 12/08/2021   Cervical polyp 01/29/2021   Knee pain, left 12/06/2019   Pre-diabetes 01/01/2018   Major depression in remission ([place]) 12/27/2017   Other neutropenia ([facility]) 08/08/2016   Genital herpes 08/08/2016   GAD (generalized anxiety disorder) 11/10/2015   Migraine without aura and responsive to treatment 01/13/2015   Other insomnia 01/13/2015    Past Surgical History:  Procedure Laterality Date   CESAREAN SECTION     COLONOSCOPY WITH PROPOFOL N/A 02/28/2020   Procedure: COLONOSCOPY WITH PROPOFOL;  Surgeon: Lucilla Lame, MD;  Location: [facility];  Service: Endoscopy;  Laterality: N/A;  priority 4   culposcopy     TUBAL LIGATION      Family History  Problem Relation Age of Onset   Diabetes Father    Heart disease Father    Hypertension Father    Drug abuse Sister    Sickle cell trait Daughter    Breast cancer Neg Hx     Social History   Socioeconomic History   Marital status: Single    Spouse name: Not on file   Number of children: 2   Years of education: Not on file   Highest education level: Not on file  Occupational History   Occupation: postal service employee  Tobacco Use   Smoking status: Never   Smokeless tobacco: Never  Vaping Use   Vaping Use: Never used  Substance and Sexual Activity   Alcohol use: Yes    Alcohol/week: 0.0 standard drinks of alcohol    Comment: occasionally (1x/month)   Drug use: No   Sexual activity: Yes    Birth control/protection: Condom    Comment: Tubal Ligation   Other Topics Concern   Not on file  Social History Narrative   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   She is living alone now.  She has grown children from previous  relationship   Social Determinants of Health   Financial Resource Strain: Low Risk  (01/31/2022)   Overall Financial Resource Strain (CARDIA)    Difficulty of Paying Living Expenses: Not hard at all  Food Insecurity: No Food Insecurity (01/31/2022)   Hunger Vital Sign    Worried About Running Out of Food in the Last Year: Never true    Ran Out of Food in the Last Year: Never true  Transportation Needs: No Transportation Needs (01/31/2022)   PRAPARE - Hydrologist (Medical): No    Lack of Transportation (Non-Medical): No  Physical Activity: Insufficiently Active (01/31/2022)   Exercise Vital Sign    Days of Exercise per Week: 4 days    Minutes of Exercise per Session: 30 min  Stress: No Stress Concern Present (01/31/2022)   [place]    Feeling of Stress : Not at all  Social Connections: Unknown (01/31/2022)   Social Connection and Isolation Panel [NHANES]    Frequency of Communication with Friends and Family: More than three times a week    Frequency of Social Gatherings with Friends and Family: Once a week    Attends Religious Services: More than 4 times per year    Active Member of [employer] or Organizations: Yes    Attends Archivist Meetings: More than 4 times per year    Marital Status: Not on file  Intimate Partner Violence: Not At Risk (01/31/2022)   Humiliation, Afraid, Rape, and Kick questionnaire    Fear of Current or Ex-Partner: No    Emotionally Abused: No    Physically Abused: No    Sexually Abused: No     Current Outpatient Medications:    acetaminophen-codeine (TYLENOL #3) 300-30 MG tablet, Take 1 tablet by mouth every 4 (four) hours as needed., Disp: , Rfl:    Cholecalciferol (VITAMIN D3) 50 MCG (2000 UT) capsule, Take 1 capsule (2,000 Units total) by mouth daily., Disp: 100 capsule, Rfl: 1   ibuprofen (ADVIL) 600 MG tablet, Take 600 mg by mouth every 6 (six)  hours as needed., Disp: , Rfl:    traZODone (DESYREL) 50 MG tablet, Take 1 tablet (50 mg total) by mouth at bedtime as needed for sleep., Disp: 90 tablet, Rfl: 1   valACYclovir (VALTREX) 500 MG tablet, Take 1 tablet (500 mg total) by mouth daily. TAKE 1 TABLET (500 MG TOTAL) BY MOUTH DAILY. TAKE 3 TIMES A DAY FOR OUTBREAKS, Disp: 110 tablet, Rfl: 1   SUMAtriptan (IMITREX) 100 MG tablet, Take 1 tablet (100 mg total) by mouth once for 1 dose., Disp: 10 tablet, Rfl: 2  Allergies  Allergen Reactions   Macrobid [Nitrofurantoin]     headache     ROS  Constitutional: Negative for fever or weight change.  Respiratory: Negative for cough and shortness of breath.   Cardiovascular: Negative for chest pain or palpitations.  Gastrointestinal: Negative for abdominal pain, no bowel changes.  Musculoskeletal: Negative for gait problem or joint swelling.  Skin: Negative for rash.  Neurological: Negative for dizziness or headache.  No other specific complaints in a complete review of systems (except as listed in HPI above).   Objective  Vitals:   01/31/22 1538  BP: 116/72  Pulse: 73  Resp: 14  Temp: 98.1 F (36.7 C)  TempSrc: Oral  SpO2: 98%  Weight: 152 lb 6.4 oz (69.1 kg)  Height: 5' 5"  (1.651 m)    Body mass index is 25.36 kg/m.  Physical Exam  Constitutional: Patient appears well-developed and well-nourished. No distress.  HENT: Head: Normocephalic and atraumatic. Ears: B TMs ok, no erythema or effusion; Nose: Nose normal. Mouth/Throat: Oropharynx is clear and  moist. No oropharyngeal exudate.  Eyes: Conjunctivae and EOM are normal. Pupils are equal, round, and reactive to light. No scleral icterus.  Neck: Normal range of motion. Neck supple. No JVD present. No thyromegaly present.  Cardiovascular: Normal rate, regular rhythm and normal heart sounds.  No murmur heard. No BLE edema. Pulmonary/Chest: Effort normal and breath sounds normal. No respiratory distress. Abdominal: Soft.  Bowel sounds are normal, no distension. There is no tenderness. no masses Breast: no lumps or masses, no nipple discharge or rashes FEMALE GENITALIA:  Not done RECTAL: not done  Musculoskeletal: Normal range of motion, no joint effusions. No gross deformities Neurological: he is alert and oriented to person, place, and time. No cranial nerve deficit. Coordination, balance, strength, speech and gait are normal.  Skin: Skin is warm and dry. No rash noted. No erythema.  Psychiatric: Patient has a normal mood and affect. behavior is normal. Judgment and thought content normal.   Fall Risk:    01/31/2022    3:42 PM 12/08/2021    1:23 PM 05/07/2021    3:10 PM 02/25/2021    8:17 AM 01/29/2021    2:10 PM  [facility] in the past year? 0 0 0 0 0  Number falls in past yr:  0 0 0 0  Injury with Fall?  0 0 0 0  Risk for fall due to : No Fall Risks No Fall Risks No Fall Risks No Fall Risks No Fall Risks  Follow up Falls prevention discussed;Education provided;Falls evaluation completed Falls prevention discussed;Education provided Falls prevention discussed Falls prevention discussed Falls prevention discussed     Functional Status Survey: Is the patient deaf or have difficulty hearing?: No Does the patient have difficulty seeing, even when wearing glasses/contacts?: No Does the patient have difficulty concentrating, remembering, or making decisions?: No Does the patient have difficulty walking or climbing stairs?: No Does the patient have difficulty dressing or bathing?: No Does the patient have difficulty doing errands alone such as visiting a doctor's office or shopping?: No   Assessment & Plan   1. Well adult exam  - Lipid panel - COMPLETE METABOLIC PANEL WITH GFR - CBC with Differential/Platelet - VITAMIN D 25 Hydroxy (Vit-D Deficiency, Fractures) - Hemoglobin A1c  2. Vitamin D deficiency  - VITAMIN D 25 Hydroxy (Vit-D Deficiency, Fractures)  3. Other neutropenia  (HCC)  - CBC with Differential/Platelet  4. Pre-diabetes  - Hemoglobin A1c  5. Lipid screening  - Lipid panel  6. Thrombocytopenia (HCC)  - CBC with Differential/Platelet  7. Long-term use of high-risk medication  - COMPLETE METABOLIC PANEL WITH GFR  8. Breast cancer screening by mammogram  - MM 3D SCREEN BREAST BILATERAL; Future   -USPSTF grade A and B recommendations reviewed with patient; age-appropriate recommendations, preventive care, screening tests, etc discussed and encouraged; healthy living encouraged; see AVS for patient education given to patient -Discussed importance of 150 minutes of physical activity weekly, eat two servings of fish weekly, eat one serving of tree nuts ( cashews, pistachios, pecans, almonds.Marland Kitchen) every other day, eat 6 servings of fruit/vegetables daily and drink plenty of water and avoid sweet beverages.   -Reviewed Health Maintenance: Yes.

## 2022-01-28 NOTE — Patient Instructions (Signed)
Preventive Care 40-53 Years Old, Female Preventive care refers to lifestyle choices and visits with your health care provider that can promote health and wellness. Preventive care visits are also called wellness exams. What can I expect for my preventive care visit? Counseling Your health care provider may ask you questions about your: Medical history, including: Past medical problems. Family medical history. Pregnancy history. Current health, including: Menstrual cycle. Method of birth control. Emotional well-being. Home life and relationship well-being. Sexual activity and sexual health. Lifestyle, including: Alcohol, nicotine or tobacco, and drug use. Access to firearms. Diet, exercise, and sleep habits. Work and work environment. Sunscreen use. Safety issues such as seatbelt and bike helmet use. Physical exam Your health care provider will check your: Height and weight. These may be used to calculate your BMI (body mass index). BMI is a measurement that tells if you are at a healthy weight. Waist circumference. This measures the distance around your waistline. This measurement also tells if you are at a healthy weight and may help predict your risk of certain diseases, such as type 2 diabetes and high blood pressure. Heart rate and blood pressure. Body temperature. Skin for abnormal spots. What immunizations do I need?  Vaccines are usually given at various ages, according to a schedule. Your health care provider will recommend vaccines for you based on your age, medical history, and lifestyle or other factors, such as travel or where you work. What tests do I need? Screening Your health care provider may recommend screening tests for certain conditions. This may include: Lipid and cholesterol levels. Diabetes screening. This is done by checking your blood sugar (glucose) after you have not eaten for a while (fasting). Pelvic exam and Pap test. Hepatitis B test. Hepatitis C  test. HIV (human immunodeficiency virus) test. STI (sexually transmitted infection) testing, if you are at risk. Lung cancer screening. Colorectal cancer screening. Mammogram. Talk with your health care provider about when you should start having regular mammograms. This may depend on whether you have a family history of breast cancer. BRCA-related cancer screening. This may be done if you have a family history of breast, ovarian, tubal, or peritoneal cancers. Bone density scan. This is done to screen for osteoporosis. Talk with your health care provider about your test results, treatment options, and if necessary, the need for more tests. Follow these instructions at home: Eating and drinking  Eat a diet that includes fresh fruits and vegetables, whole grains, lean protein, and low-fat dairy products. Take vitamin and mineral supplements as recommended by your health care provider. Do not drink alcohol if: Your health care provider tells you not to drink. You are pregnant, may be pregnant, or are planning to become pregnant. If you drink alcohol: Limit how much you have to 0-1 drink a day. Know how much alcohol is in your drink. In the [place] one drink equals one 12 oz bottle of beer (355 mL), one 5 oz glass of wine (148 mL), or one 1 oz glass of hard liquor (44 mL). Lifestyle Brush your teeth every morning and night with fluoride toothpaste. Floss one time each day. Exercise for at least 30 minutes 5 or more days each week. Do not use any products that contain nicotine or tobacco. These products include cigarettes, chewing tobacco, and vaping devices, such as e-cigarettes. If you need help quitting, ask your health care provider. Do not use drugs. If you are sexually active, practice safe sex. Use a condom or other form of protection to   prevent STIs. If you do not wish to become pregnant, use a form of birth control. If you plan to become pregnant, see your health care provider for a  prepregnancy visit. Take aspirin only as told by your health care provider. Make sure that you understand how much to take and what form to take. Work with your health care provider to find out whether it is safe and beneficial for you to take aspirin daily. Find healthy ways to manage stress, such as: Meditation, yoga, or listening to music. Journaling. Talking to a trusted person. Spending time with friends and family. Minimize exposure to UV radiation to reduce your risk of skin cancer. Safety Always wear your seat belt while driving or riding in a vehicle. Do not drive: If you have been drinking alcohol. Do not ride with someone who has been drinking. When you are tired or distracted. While texting. If you have been using any mind-altering substances or drugs. Wear a helmet and other protective equipment during sports activities. If you have firearms in your house, make sure you follow all gun safety procedures. Seek help if you have been physically or sexually abused. What's next? Visit your health care provider once a year for an annual wellness visit. Ask your health care provider how often you should have your eyes and teeth checked. Stay up to date on all vaccines. This information is not intended to replace advice given to you by your health care provider. Make sure you discuss any questions you have with your health care provider. Document Revised: 09/23/2020 Document Reviewed: 09/23/2020 Elsevier Patient Education  [place].

## 2022-01-29 IMAGING — MG MM DIGITAL SCREENING BILAT W/ TOMO AND CAD
8 series · 9 of 24 positions shown · non-contrast
Comparison: Previous exam(s).

CLINICAL DATA: Screening.

EXAM:
DIGITAL SCREENING BILATERAL MAMMOGRAM WITH TOMOSYNTHESIS AND CAD
TECHNIQUE: Bilateral screening digital craniocaudal and mediolateral oblique
mammograms were obtained. Bilateral screening digital breast
tomosynthesis was performed. The images were evaluated with
computer-aided detection.

[L MLO synth-2D]
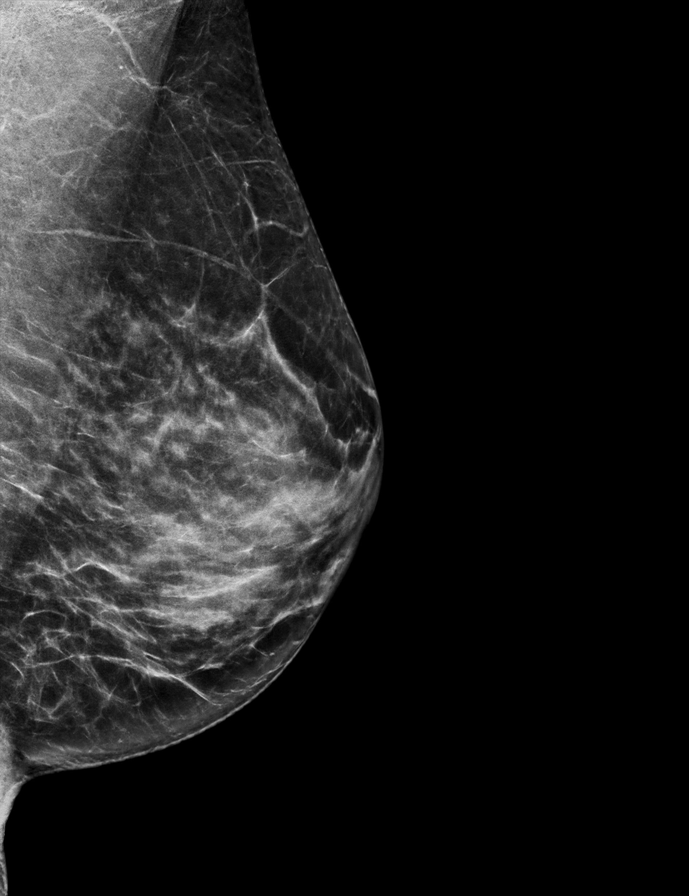

[L CC synth-2D]
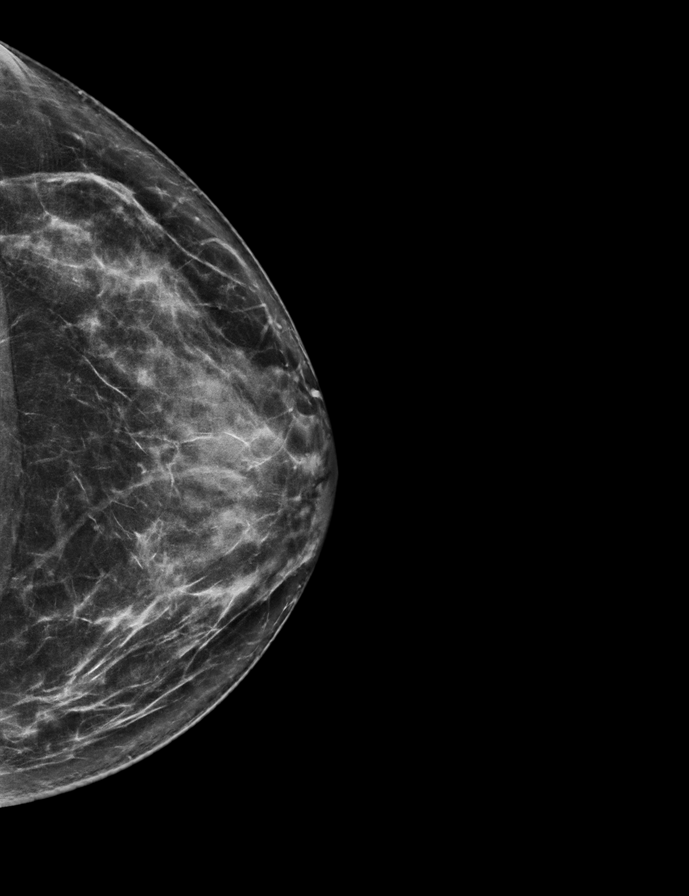

[R CC synth-2D]
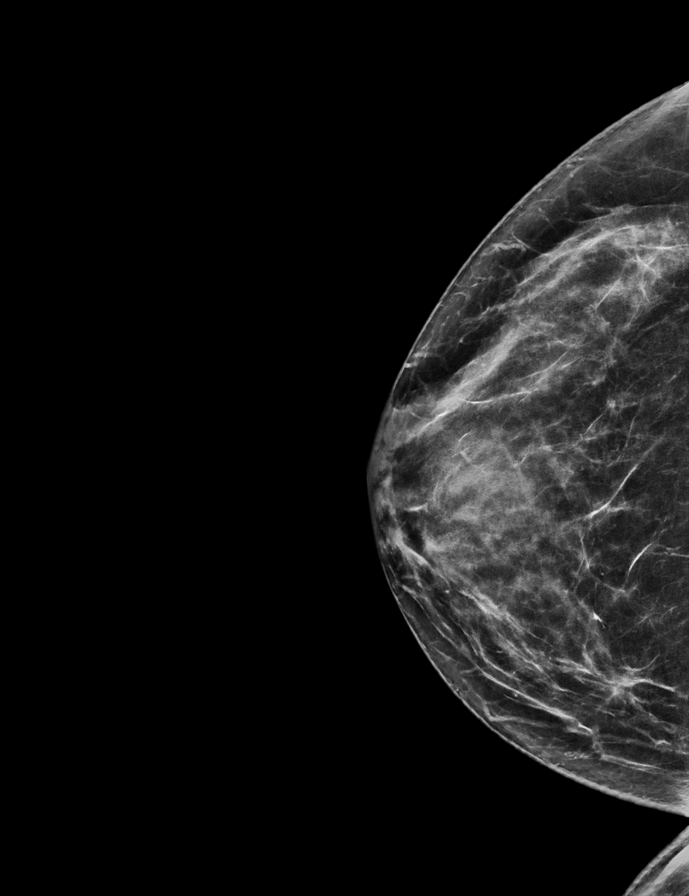

[R MLO synth-2D]
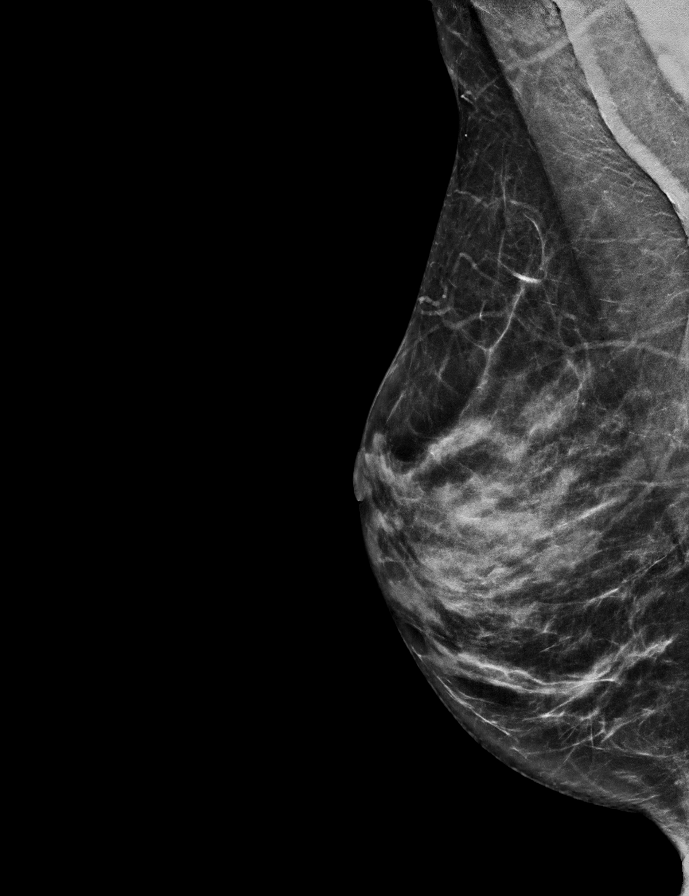

[R MLO tomo · 2 of 63 frames shown]
[frame 21/63]
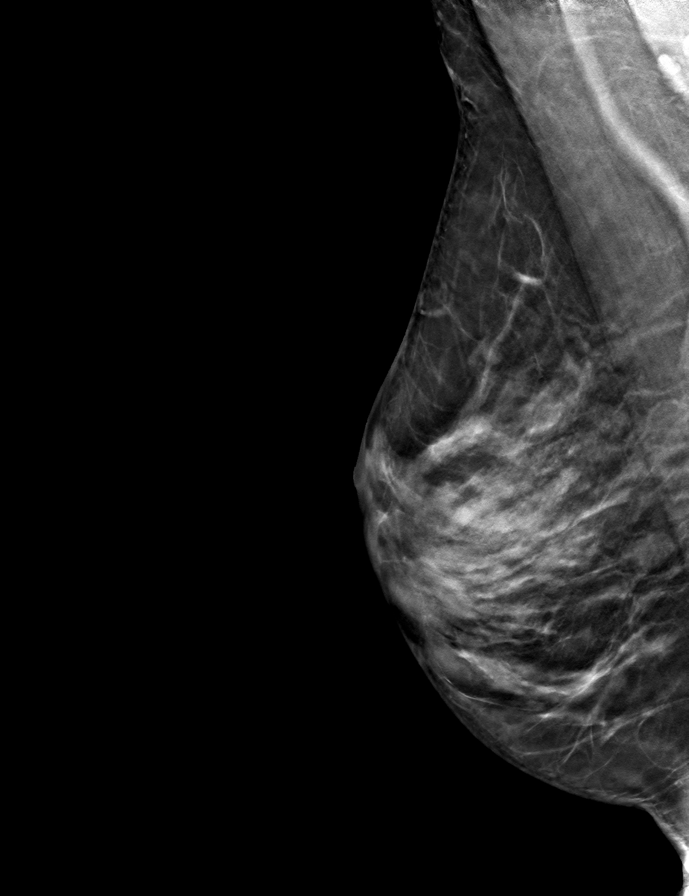
[frame 32/63]
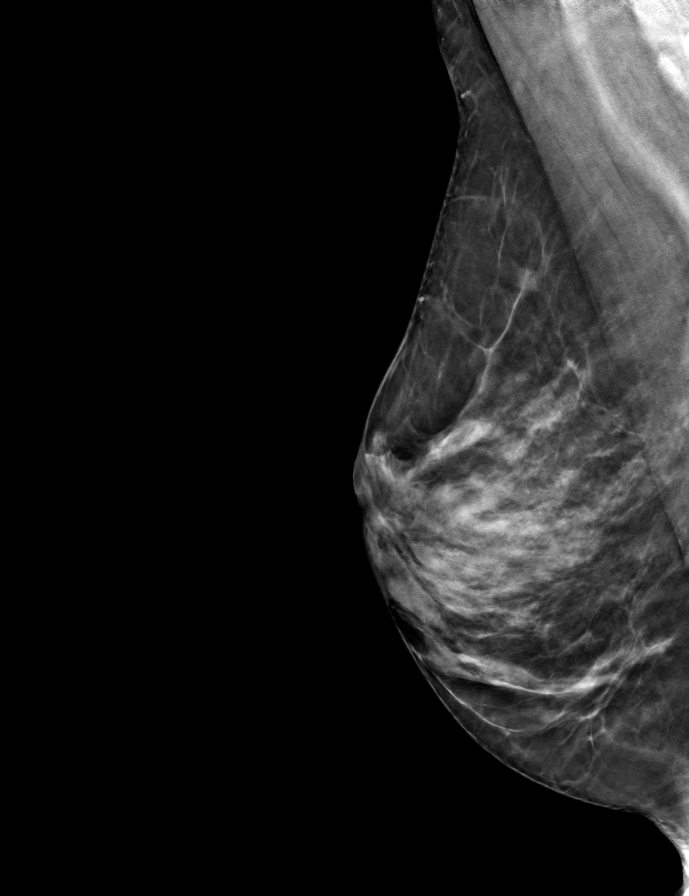

[R CC tomo · tomo slice 33/65.0]
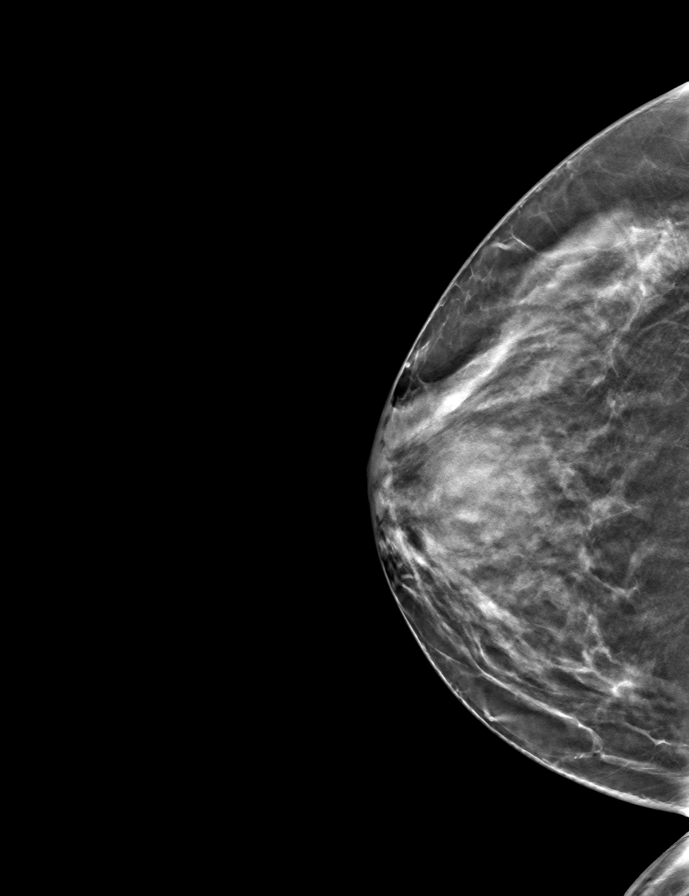

[L MLO tomo · tomo slice 34/67.0]
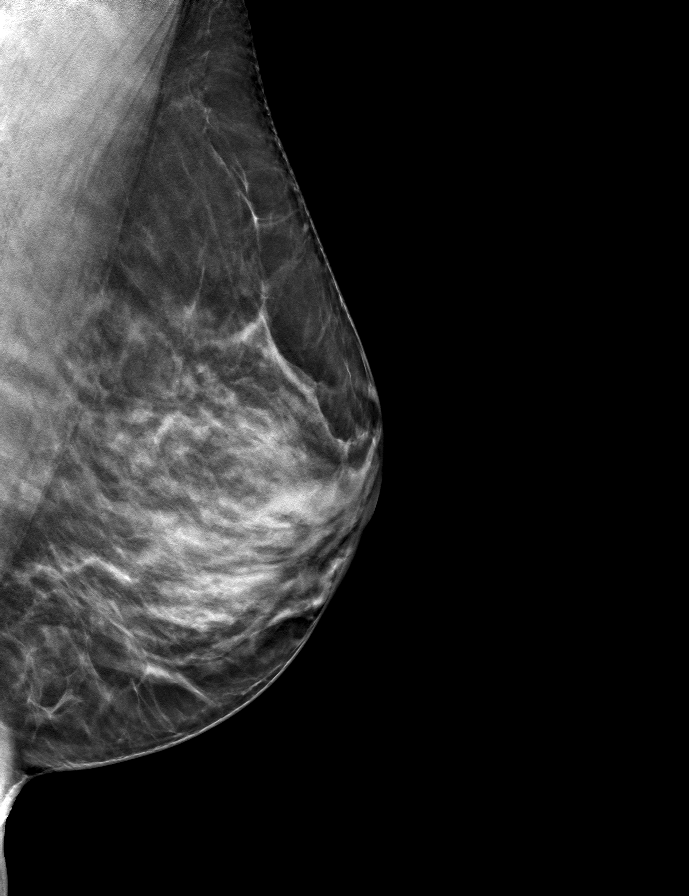

[L CC tomo · tomo slice 37/72.0]
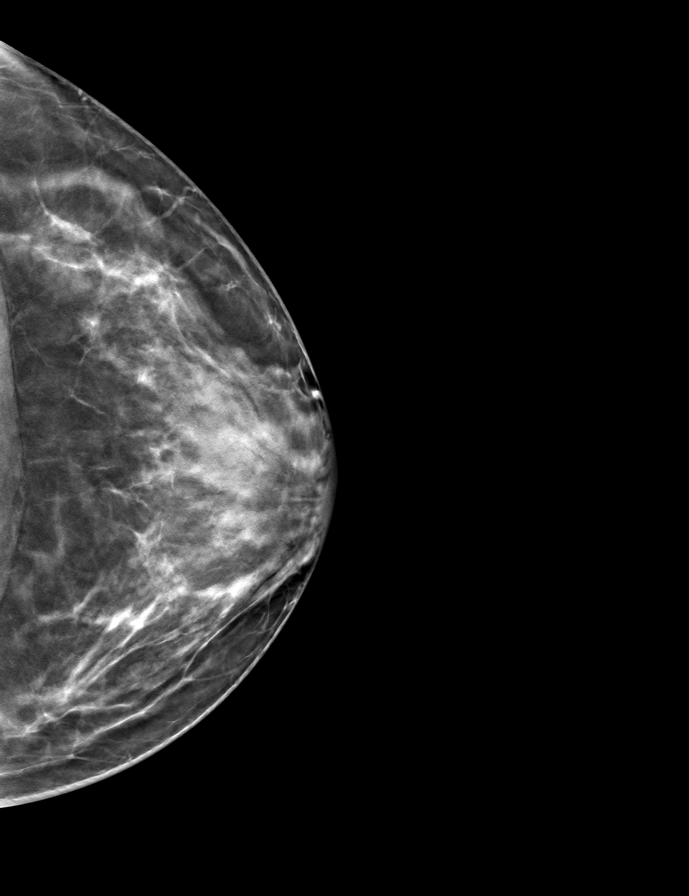

[9 of 24 positions shown; findings below may reference images not displayed]

ACR Breast Density Category c: The breast tissue is heterogeneously
dense, which may obscure small masses.
FINDINGS: There are no findings suspicious for malignancy.
IMPRESSION: No mammographic evidence of malignancy. A result letter of this
screening mammogram will be mailed directly to the patient.

RECOMMENDATION:
Screening mammogram in one year. (Code:Q3-W-BC3)

BI-RADS CATEGORY  1: Negative.

## 2022-01-31 ENCOUNTER — Encounter: Payer: Self-pay | Admitting: Family Medicine

## 2022-01-31 ENCOUNTER — Ambulatory Visit (INDEPENDENT_AMBULATORY_CARE_PROVIDER_SITE_OTHER): Payer: Federal, State, Local not specified - PPO | Admitting: Family Medicine

## 2022-01-31 VITALS — BP 116/72 | HR 73 | Temp 98.1°F | Resp 14 | Ht 65.0 in | Wt 152.4 lb

## 2022-01-31 DIAGNOSIS — Z1231 Encounter for screening mammogram for malignant neoplasm of breast: Secondary | ICD-10-CM

## 2022-01-31 DIAGNOSIS — Z0000 Encounter for general adult medical examination without abnormal findings: Secondary | ICD-10-CM

## 2022-01-31 DIAGNOSIS — R7303 Prediabetes: Secondary | ICD-10-CM | POA: Diagnosis not present

## 2022-01-31 DIAGNOSIS — D696 Thrombocytopenia, unspecified: Secondary | ICD-10-CM

## 2022-01-31 DIAGNOSIS — Z79899 Other long term (current) drug therapy: Secondary | ICD-10-CM

## 2022-01-31 DIAGNOSIS — Z13220 Encounter for screening for lipoid disorders: Secondary | ICD-10-CM

## 2022-01-31 DIAGNOSIS — D708 Other neutropenia: Secondary | ICD-10-CM | POA: Diagnosis not present

## 2022-01-31 DIAGNOSIS — E559 Vitamin D deficiency, unspecified: Secondary | ICD-10-CM | POA: Diagnosis not present

## 2022-04-09 DIAGNOSIS — R0981 Nasal congestion: Secondary | ICD-10-CM | POA: Diagnosis not present

## 2022-04-09 DIAGNOSIS — U071 COVID-19: Secondary | ICD-10-CM | POA: Diagnosis not present

## 2022-04-09 DIAGNOSIS — R059 Cough, unspecified: Secondary | ICD-10-CM | POA: Diagnosis not present

## 2022-04-27 ENCOUNTER — Encounter: Payer: Self-pay | Admitting: Family Medicine

## 2022-04-27 ENCOUNTER — Ambulatory Visit (INDEPENDENT_AMBULATORY_CARE_PROVIDER_SITE_OTHER): Payer: Federal, State, Local not specified - PPO | Admitting: Family Medicine

## 2022-04-27 VITALS — BP 126/72 | HR 86 | Temp 98.6°F | Resp 16 | Ht 66.0 in | Wt 157.5 lb

## 2022-04-27 DIAGNOSIS — Z8616 Personal history of COVID-19: Secondary | ICD-10-CM

## 2022-04-27 DIAGNOSIS — R062 Wheezing: Secondary | ICD-10-CM

## 2022-04-27 DIAGNOSIS — R052 Subacute cough: Secondary | ICD-10-CM | POA: Diagnosis not present

## 2022-04-27 MED ORDER — TRELEGY ELLIPTA 100-62.5-25 MCG/ACT IN AEPB: 1.0000 | INHALATION_SPRAY | Freq: Every day | RESPIRATORY_TRACT | 0 refills | Status: DC

## 2022-04-27 MED ORDER — PREDNISONE 20 MG PO TABS: 20.0000 mg | ORAL_TABLET | Freq: Two times a day (BID) | ORAL | 0 refills | Status: DC

## 2022-04-27 MED ORDER — BENZONATATE 100 MG PO CAPS: 100.0000 mg | ORAL_CAPSULE | Freq: Two times a day (BID) | ORAL | 0 refills | Status: DC | PRN

## 2022-04-27 NOTE — Progress Notes (Signed)
Name: Tammy Evans   MRN: [id number]    DOB: 08/18/1970   Date:04/27/2022       Progress Note  Subjective  Chief Complaint  Cough/ Congestion  HPI  Dry cough: she developed cold symptoms such as rhinorrhea and nasal congestion on Dec 26 th, 2023, after that developed chest congestion and a cough , went to urgent care on Dec 30 th and rapid antigen test was positive for COVID-19. They advised benzonatate , advised to isolate, drink fluids . She states she continues to have a dry cough, chest congestion, now it hurts her chest when she cough, SOB with activity. She has also noticed some wheezing. She denies fever or chills. She has normal appetite, normal sense of taste and smell.    Patient Active Problem List   Diagnosis Date Noted   Menopausal vasomotor syndrome 12/08/2021   Vitamin D deficiency 12/08/2021   Cervical polyp 01/29/2021   Knee pain, left 12/06/2019   Pre-diabetes 01/01/2018   Major depression in remission ([facility]) 12/27/2017   Other neutropenia ([place]) 08/08/2016   Genital herpes 08/08/2016   GAD (generalized anxiety disorder) 11/10/2015   Migraine without aura and responsive to treatment 01/13/2015   Other insomnia 01/13/2015    Past Surgical History:  Procedure Laterality Date   CESAREAN SECTION     COLONOSCOPY WITH PROPOFOL N/A 02/28/2020   Procedure: COLONOSCOPY WITH PROPOFOL;  Surgeon: Lucilla Lame, MD;  Location: [facility];  Service: Endoscopy;  Laterality: N/A;  priority 4   culposcopy     TUBAL LIGATION      Family History  Problem Relation Age of Onset   Diabetes Father    Heart disease Father    Hypertension Father    Drug abuse Sister    Sickle cell trait Daughter    Breast cancer Neg Hx     Social History   Tobacco Use   Smoking status: Never   Smokeless tobacco: Never  Substance Use Topics   Alcohol use: Yes    Alcohol/week: 0.0 standard drinks of alcohol    Comment: occasionally (1x/month)     Current Outpatient  Medications:    acetaminophen-codeine (TYLENOL #3) 300-30 MG tablet, Take 1 tablet by mouth every 4 (four) hours as needed., Disp: , Rfl:    Cholecalciferol (VITAMIN D3) 50 MCG (2000 UT) capsule, Take 1 capsule (2,000 Units total) by mouth daily., Disp: 100 capsule, Rfl: 1   ibuprofen (ADVIL) 600 MG tablet, Take 600 mg by mouth every 6 (six) hours as needed., Disp: , Rfl:    traZODone (DESYREL) 50 MG tablet, Take 1 tablet (50 mg total) by mouth at bedtime as needed for sleep., Disp: 90 tablet, Rfl: 1   valACYclovir (VALTREX) 500 MG tablet, Take 1 tablet (500 mg total) by mouth daily. TAKE 1 TABLET (500 MG TOTAL) BY MOUTH DAILY. TAKE 3 TIMES A DAY FOR OUTBREAKS, Disp: 110 tablet, Rfl: 1   SUMAtriptan (IMITREX) 100 MG tablet, Take 1 tablet (100 mg total) by mouth once for 1 dose., Disp: 10 tablet, Rfl: 2  Allergies  Allergen Reactions   Macrobid [Nitrofurantoin]     headache    I personally reviewed active problem list, medication list, allergies, family history, social history, health maintenance with the patient/caregiver today.   ROS  Ten systems reviewed and is negative except as mentioned in HPI   Objective  Vitals:   04/27/22 1518 04/27/22 1522  BP:  126/72  Pulse:  86  Resp: 16 16  Temp:  98.6 F (37 C)  TempSrc:  Oral  SpO2:  98%  Weight:  157 lb 8 oz (71.4 kg)  Height: 5\' 5"  (1.651 m) 5\' 6"  (1.676 m)    Body mass index is 25.42 kg/m.  Physical Exam  Constitutional: Patient appears well-developed and well-nourished.  No distress.  HEENT: head atraumatic, normocephalic, pupils equal and reactive to light, ears normal TM, neck supple, throat within normal limits Cardiovascular: Normal rate, regular rhythm and normal heart sounds.  No murmur heard. No BLE edema. Pulmonary/Chest: Effort normal and breath sounds normal. No respiratory distress. Abdominal: Soft.  There is no tenderness. Psychiatric: Patient has a normal mood and affect. behavior is normal. Judgment and  thought content normal.   PHQ2/9:    04/27/2022    3:18 PM 01/31/2022    3:41 PM 12/08/2021    1:23 PM 05/07/2021    3:10 PM 02/25/2021    8:17 AM  Depression screen PHQ 2/9  Decreased Interest 0 0 0 0 0  Down, Depressed, Hopeless 0 0 0 0 0  PHQ - 2 Score 0 0 0 0 0  Altered sleeping 0 0 0 0 0  Tired, decreased energy 0 0 0 0 0  Change in appetite 0 0 0 0 0  Feeling bad or failure about yourself  0 0 0 0 0  Trouble concentrating 0 0 0 0 0  Moving slowly or fidgety/restless 0 0 0 0 0  Suicidal thoughts 0 0 0 0 0  PHQ-9 Score 0 0 0 0 0  Difficult doing work/chores Not difficult at all  Not difficult at all Not difficult at all     phq 9 is negative   Fall Risk:    04/27/2022    3:18 PM 01/31/2022    3:42 PM 12/08/2021    1:23 PM 05/07/2021    3:10 PM 02/25/2021    8:17 AM  Fall Risk   Falls in the past year? 0 0 0 0 0  Number falls in past yr: 0  0 0 0  Injury with Fall? 0  0 0 0  Risk for fall due to : No Fall Risks No Fall Risks No Fall Risks No Fall Risks No Fall Risks  Follow up Falls prevention discussed;Education provided;Falls evaluation completed Falls prevention discussed;Education provided;Falls evaluation completed Falls prevention discussed;Education provided Falls prevention discussed Falls prevention discussed      Functional Status Survey: Is the patient deaf or have difficulty hearing?: No Does the patient have difficulty seeing, even when wearing glasses/contacts?: No Does the patient have difficulty concentrating, remembering, or making decisions?: No Does the patient have difficulty walking or climbing stairs?: No Does the patient have difficulty dressing or bathing?: No Does the patient have difficulty doing errands alone such as visiting a doctor's office or shopping?: No    Assessment & Plan  1. Subacute cough  - predniSONE (DELTASONE) 20 MG tablet; Take 1 tablet (20 mg total) by mouth 2 (two) times daily with a meal.  Dispense: 10 tablet;  Refill: 0 - benzonatate (TESSALON) 100 MG capsule; Take 1-2 capsules (100-200 mg total) by mouth 2 (two) times daily as needed.  Dispense: 40 capsule; Refill: 0 - Fluticasone-Umeclidin-Vilant (TRELEGY ELLIPTA) 100-62.5-25 MCG/ACT AEPB; Inhale 1 puff into the lungs daily.  Dispense: 1 each; Refill: 0  2. History of COVID-19   3. Wheezing  - predniSONE (DELTASONE) 20 MG tablet; Take 1 tablet (20 mg total) by mouth 2 (two) times daily with a meal.  Dispense: 10 tablet; Refill: 0

## 2022-04-29 ENCOUNTER — Other Ambulatory Visit: Payer: Self-pay | Admitting: Family Medicine

## 2022-04-29 ENCOUNTER — Ambulatory Visit: Payer: Self-pay

## 2022-04-29 DIAGNOSIS — R052 Subacute cough: Secondary | ICD-10-CM

## 2022-04-29 MED ORDER — FLUCONAZOLE 150 MG PO TABS: 150.0000 mg | ORAL_TABLET | ORAL | 0 refills | Status: DC

## 2022-04-29 MED ORDER — AZITHROMYCIN 250 MG PO TABS: ORAL_TABLET | ORAL | 0 refills | Status: AC

## 2022-04-29 NOTE — Telephone Encounter (Signed)
Summary: med request/cough and congestion   Patient called in says still has cough and congestion and says she was told By Dr Ancil Boozer that she would send her a zpac and she says she also needs diflucan sincy she gets a yeast infection after taking zpac     Called pt - LMOMTCB

## 2022-04-29 NOTE — Telephone Encounter (Signed)
Patient notified

## 2022-04-29 NOTE — Telephone Encounter (Signed)
     Chief Complaint: Still has cough, congestion. Seen in office. Asking for antibiotic and Diflucan. Symptoms: Wheezing, chest tightness Frequency: December Pertinent Negatives: Patient denies fever Disposition: [] ED /[] Urgent Care (no appt availability in office) / [] Appointment(In office/virtual)/ []  [facility]/ [] Home Care/ [] Refused Recommended Disposition /[] [facility]/ [x]  Follow-up with PCP Additional Notes: Please advise pt.  Answer Assessment - Initial Assessment Questions 1. ONSET: "When did the cough begin?"      December 2. SEVERITY: "How bad is the cough today?"      Severe 3. SPUTUM: "Describe the color of your sputum" (none, dry cough; clear, white, yellow, green)     Clear 4. HEMOPTYSIS: "Are you coughing up any blood?" If so ask: "How much?" (flecks, streaks, tablespoons, etc.)     No 5. DIFFICULTY BREATHING: "Are you having difficulty breathing?" If Yes, ask: "How bad is it?" (e.g., mild, moderate, severe)    - MILD: No SOB at rest, mild SOB with walking, speaks normally in sentences, can lie down, no retractions, pulse < 100.    - MODERATE: SOB at rest, SOB with minimal exertion and prefers to sit, cannot lie down flat, speaks in phrases, mild retractions, audible wheezing, pulse 100-120.    - SEVERE: Very SOB at rest, speaks in single words, struggling to breathe, sitting hunched forward, retractions, pulse > 120      Mild 6. FEVER: "Do you have a fever?" If Yes, ask: "What is your temperature, how was it measured, and when did it start?"     No 7. CARDIAC HISTORY: "Do you have any history of heart disease?" (e.g., heart attack, congestive heart failure)      No 8. LUNG HISTORY: "Do you have any history of lung disease?"  (e.g., pulmonary embolus, asthma, emphysema)     No 9. PE RISK FACTORS: "Do you have a history of blood clots?" (or: recent major surgery, recent prolonged travel, bedridden)     No 10. OTHER SYMPTOMS: "Do you have any  other symptoms?" (e.g., runny nose, wheezing, chest pain)       Chest tightness, wheezing 11. PREGNANCY: "Is there any chance you are pregnant?" "When was your last menstrual period?"       No 12. TRAVEL: "Have you traveled out of the country in the last month?" (e.g., travel history, exposures)       No  Protocols used: Cough - Acute Productive-A-AH

## 2022-06-15 DIAGNOSIS — R350 Frequency of micturition: Secondary | ICD-10-CM | POA: Diagnosis not present

## 2022-06-15 DIAGNOSIS — N760 Acute vaginitis: Secondary | ICD-10-CM | POA: Diagnosis not present

## 2022-06-15 DIAGNOSIS — B9689 Other specified bacterial agents as the cause of diseases classified elsewhere: Secondary | ICD-10-CM | POA: Diagnosis not present

## 2022-07-27 ENCOUNTER — Ambulatory Visit
Admission: RE | Admit: 2022-07-27 | Discharge: 2022-07-27 | Disposition: A | Discharge status: Home / Self Care | Payer: Federal, State, Local not specified - PPO | Source: Ambulatory Visit | Attending: Family Medicine | Admitting: Family Medicine

## 2022-07-27 DIAGNOSIS — Z1231 Encounter for screening mammogram for malignant neoplasm of breast: Secondary | ICD-10-CM | POA: Diagnosis not present

## 2022-07-28 ENCOUNTER — Other Ambulatory Visit: Payer: Self-pay | Admitting: Family Medicine

## 2022-07-28 DIAGNOSIS — N63 Unspecified lump in unspecified breast: Secondary | ICD-10-CM

## 2022-07-28 DIAGNOSIS — R928 Other abnormal and inconclusive findings on diagnostic imaging of breast: Secondary | ICD-10-CM

## 2022-07-29 ENCOUNTER — Other Ambulatory Visit: Payer: Self-pay

## 2022-07-29 DIAGNOSIS — R928 Other abnormal and inconclusive findings on diagnostic imaging of breast: Secondary | ICD-10-CM

## 2022-08-01 NOTE — Progress Notes (Unsigned)
Name: Tammy Evans   MRN: [id number]    DOB: 1970/09/04   Date:08/02/2022       Progress Note  Subjective  Chief Complaint  Follow Up  HPI  Menopause: she stopped having cycles since Feb 2021- she states hot flashes and night sweats are mild now , stable symptoms   Migraine headaches: She continues to have migraines intermittently about twice a month . Certain things like different smells and lack of sleep trigger her migraines. She works third shift .Imitrex works well for her , she needs to take a nap after she takes medication, we will try Nurtec and it may help her stay at work during an episode . She states not missing a lot of days lately but needs forms filled out .   Pre-diabetes: last A1C was 5.7% last visit, she likes sweets and has difficulty cutting on carbohydrates. She is physically active We will recheck labs. There is a family history of diabetes on both sides. She denies polyphagia, polydipsia or polyuria   Vitamin D deficiency: she states that she forgets to take medication once a week, we will change to vitamin D otc 2000 units daily We will recheck labs today   Low white count: stable it goes up and down, used to have low platelets but that has resolved.  We will recheck labs   Insomnia/works 3 rd shift: only taking prn Trazodone prn , usually sleeps well, unless when she is stressed out   Herpes genitalis: she needs a refill of valtrex, she only takes it prn, she takes medications as soon as episodes starts to avoid complete outbreak   Patient Active Problem List   Diagnosis Date Noted   Menopausal vasomotor syndrome 12/08/2021   Vitamin D deficiency 12/08/2021   Cervical polyp 01/29/2021   Knee pain, left 12/06/2019   Pre-diabetes 01/01/2018   Major depression in remission 12/27/2017   Other neutropenia 08/08/2016   Genital herpes 08/08/2016   GAD (generalized anxiety disorder) 11/10/2015   Migraine without aura and responsive to treatment 01/13/2015    Other insomnia 01/13/2015    Past Surgical History:  Procedure Laterality Date   CESAREAN SECTION     COLONOSCOPY WITH PROPOFOL N/A 02/28/2020   Procedure: COLONOSCOPY WITH PROPOFOL;  Surgeon: Midge Minium, MD;  Location: [facility];  Service: Endoscopy;  Laterality: N/A;  priority 4   culposcopy     TUBAL LIGATION      Family History  Problem Relation Age of Onset   Diabetes Father    Heart disease Father    Hypertension Father    Drug abuse Sister    Sickle cell trait Daughter    Breast cancer Neg Hx     Social History   Tobacco Use   Smoking status: Never   Smokeless tobacco: Never  Substance Use Topics   Alcohol use: Yes    Alcohol/week: 0.0 standard drinks of alcohol    Comment: occasionally (1x/month)     Current Outpatient Medications:    acetaminophen-codeine (TYLENOL #3) 300-30 MG tablet, Take 1 tablet by mouth every 4 (four) hours as needed., Disp: , Rfl:    Cholecalciferol (VITAMIN D3) 50 MCG (2000 UT) capsule, Take 1 capsule (2,000 Units total) by mouth daily., Disp: 100 capsule, Rfl: 1   Fluticasone-Umeclidin-Vilant (TRELEGY ELLIPTA) 100-62.5-25 MCG/ACT AEPB, Inhale 1 puff into the lungs daily., Disp: 1 each, Rfl: 0   ibuprofen (ADVIL) 600 MG tablet, Take 600 mg by mouth every 6 (six) hours as needed.,  Disp: , Rfl:    traZODone (DESYREL) 50 MG tablet, Take 1 tablet (50 mg total) by mouth at bedtime as needed for sleep., Disp: 90 tablet, Rfl: 1   valACYclovir (VALTREX) 500 MG tablet, Take 1 tablet (500 mg total) by mouth daily. TAKE 1 TABLET (500 MG TOTAL) BY MOUTH DAILY. TAKE 3 TIMES A DAY FOR OUTBREAKS, Disp: 110 tablet, Rfl: 1   benzonatate (TESSALON) 100 MG capsule, Take 1-2 capsules (100-200 mg total) by mouth 2 (two) times daily as needed. (Patient not taking: Reported on 08/02/2022), Disp: 40 capsule, Rfl: 0   fluconazole (DIFLUCAN) 150 MG tablet, Take 1 tablet (150 mg total) by mouth every other day. (Patient not taking: Reported on 08/02/2022),  Disp: 3 tablet, Rfl: 0   predniSONE (DELTASONE) 20 MG tablet, Take 1 tablet (20 mg total) by mouth 2 (two) times daily with a meal. (Patient not taking: Reported on 08/02/2022), Disp: 10 tablet, Rfl: 0   SUMAtriptan (IMITREX) 100 MG tablet, Take 1 tablet (100 mg total) by mouth once for 1 dose., Disp: 10 tablet, Rfl: 2  Allergies  Allergen Reactions   Macrobid [Nitrofurantoin]     headache    I personally reviewed active problem list, medication list, allergies, family history, social history, health maintenance with the patient/caregiver today.   ROS  Constitutional: Negative for fever or weight change.  Respiratory: Negative for cough and shortness of breath.   Cardiovascular: Negative for chest pain or palpitations.  Gastrointestinal: Negative for abdominal pain, no bowel changes.  Musculoskeletal: Negative for gait problem or joint swelling.  Skin: Negative for rash.  Neurological: Negative for dizziness , positive for intermittent headache.  No other specific complaints in a complete review of systems (except as listed in HPI above).   Objective  Vitals:   08/02/22 1512  BP: 120/78  Pulse: 86  Resp: 16  SpO2: 100%  Weight: 158 lb (71.7 kg)  Height: 5\' 3"  (1.6 m)    Body mass index is 27.99 kg/m.  Physical Exam  Constitutional: Patient appears well-developed and well-nourished.  No distress.  HEENT: head atraumatic, normocephalic, pupils equal and reactive to light, neck supple Cardiovascular: Normal rate, regular rhythm and normal heart sounds.  No murmur heard. No BLE edema. Pulmonary/Chest: Effort normal and breath sounds normal. No respiratory distress. Abdominal: Soft.  There is no tenderness. Psychiatric: Patient has a normal mood and affect. behavior is normal. Judgment and thought content normal.    PHQ2/9:    08/02/2022    3:12 PM 04/27/2022    3:18 PM 01/31/2022    3:41 PM 12/08/2021    1:23 PM 05/07/2021    3:10 PM  Depression screen PHQ 2/9   Decreased Interest 0 0 0 0 0  Down, Depressed, Hopeless 0 0 0 0 0  PHQ - 2 Score 0 0 0 0 0  Altered sleeping 0 0 0 0 0  Tired, decreased energy 0 0 0 0 0  Change in appetite 0 0 0 0 0  Feeling bad or failure about yourself  0 0 0 0 0  Trouble concentrating 0 0 0 0 0  Moving slowly or fidgety/restless 0 0 0 0 0  Suicidal thoughts 0 0 0 0 0  PHQ-9 Score 0 0 0 0 0  Difficult doing work/chores  Not difficult at all  Not difficult at all Not difficult at all    phq 9 is negative   Fall Risk:    08/02/2022    3:12 PM 04/27/2022  3:18 PM 01/31/2022    3:42 PM 12/08/2021    1:23 PM 05/07/2021    3:10 PM  Fall Risk   Falls in the past year? 0 0 0 0 0  Number falls in past yr: 0 0  0 0  Injury with Fall? 0 0  0 0  Risk for fall due to : No Fall Risks No Fall Risks No Fall Risks No Fall Risks No Fall Risks  Follow up Falls prevention discussed Falls prevention discussed;Education provided;Falls evaluation completed Falls prevention discussed;Education provided;Falls evaluation completed Falls prevention discussed;Education provided Falls prevention discussed      Functional Status Survey: Is the patient deaf or have difficulty hearing?: No Does the patient have difficulty seeing, even when wearing glasses/contacts?: No Does the patient have difficulty concentrating, remembering, or making decisions?: No Does the patient have difficulty walking or climbing stairs?: No Does the patient have difficulty dressing or bathing?: No Does the patient have difficulty doing errands alone such as visiting a doctor's office or shopping?: No    Assessment & Plan  1. Other insomnia  Continue prn medication   2. Migraine without aura and responsive to treatment  - SUMAtriptan (IMITREX) 100 MG tablet; Take 1 tablet (100 mg total) by mouth every 2 (two) hours as needed for migraine.  Dispense: 10 tablet; Refill: 2 - Rimegepant Sulfate (NURTEC) 75 MG TBDP; Take 1 tablet (75 mg total) by mouth  daily as needed.  Dispense: 16 tablet; Refill: 0  3. Pre-diabetes  Recheck labs   4. Vitamin D deficiency  Continue supplementation   5. Genital herpes simplex, unspecified site  Only taking prn medication   6. Other neutropenia  Recheck labs    7. Abnormal mammogram of right breast  Discussed with patient, reassurance given that it is not always cancer

## 2022-08-02 ENCOUNTER — Ambulatory Visit (INDEPENDENT_AMBULATORY_CARE_PROVIDER_SITE_OTHER): Payer: Federal, State, Local not specified - PPO | Admitting: Family Medicine

## 2022-08-02 ENCOUNTER — Encounter: Payer: Self-pay | Admitting: Family Medicine

## 2022-08-02 VITALS — BP 120/78 | HR 86 | Resp 16 | Ht 63.0 in | Wt 158.0 lb

## 2022-08-02 DIAGNOSIS — G4709 Other insomnia: Secondary | ICD-10-CM | POA: Diagnosis not present

## 2022-08-02 DIAGNOSIS — E559 Vitamin D deficiency, unspecified: Secondary | ICD-10-CM | POA: Diagnosis not present

## 2022-08-02 DIAGNOSIS — D708 Other neutropenia: Secondary | ICD-10-CM

## 2022-08-02 DIAGNOSIS — G43009 Migraine without aura, not intractable, without status migrainosus: Secondary | ICD-10-CM

## 2022-08-02 DIAGNOSIS — R7303 Prediabetes: Secondary | ICD-10-CM | POA: Diagnosis not present

## 2022-08-02 DIAGNOSIS — A6000 Herpesviral infection of urogenital system, unspecified: Secondary | ICD-10-CM

## 2022-08-02 DIAGNOSIS — R928 Other abnormal and inconclusive findings on diagnostic imaging of breast: Secondary | ICD-10-CM

## 2022-08-02 DIAGNOSIS — D696 Thrombocytopenia, unspecified: Secondary | ICD-10-CM | POA: Diagnosis not present

## 2022-08-02 DIAGNOSIS — Z79899 Other long term (current) drug therapy: Secondary | ICD-10-CM | POA: Diagnosis not present

## 2022-08-02 MED ORDER — NURTEC 75 MG PO TBDP: 1.0000 | ORAL_TABLET | Freq: Every day | ORAL | 0 refills | Status: DC | PRN

## 2022-08-02 MED ORDER — SUMATRIPTAN SUCCINATE 100 MG PO TABS: 100.0000 mg | ORAL_TABLET | ORAL | 2 refills | Status: DC | PRN

## 2022-08-03 LAB — CBC WITH DIFFERENTIAL/PLATELET
Absolute Monocytes: 323 cells/uL (ref 200–950)
Basophils Absolute: 20 cells/uL (ref 0–200)
Basophils Relative: 0.6 %
Eosinophils Absolute: 82 cells/uL (ref 15–500)
Eosinophils Relative: 2.4 %
HCT: 38.4 % (ref 35.0–45.0)
Hemoglobin: 12.8 g/dL (ref 11.7–15.5)
Lymphs Abs: 945 cells/uL (ref 850–3900)
MCH: 29.8 pg (ref 27.0–33.0)
MCHC: 33.3 g/dL (ref 32.0–36.0)
MCV: 89.5 fL (ref 80.0–100.0)
MPV: 13.3 fL — ABNORMAL HIGH (ref 7.5–12.5)
Monocytes Relative: 9.5 %
Neutro Abs: 2030 cells/uL (ref 1500–7800)
Neutrophils Relative %: 59.7 %
Platelets: 167 10*3/uL (ref 140–400)
RBC: 4.29 10*6/uL (ref 3.80–5.10)
RDW: 13.5 % (ref 11.0–15.0)
Total Lymphocyte: 27.8 %
WBC: 3.4 10*3/uL — ABNORMAL LOW (ref 3.8–10.8)

## 2022-08-03 LAB — COMPLETE METABOLIC PANEL WITH GFR
AG Ratio: 1.7 (calc) (ref 1.0–2.5)
ALT: 15 U/L (ref 6–29)
AST: 18 U/L (ref 10–35)
Albumin: 4.5 g/dL (ref 3.6–5.1)
Alkaline phosphatase (APISO): 87 U/L (ref 37–153)
BUN: 14 mg/dL (ref 7–25)
CO2: 29 mmol/L (ref 20–32)
Calcium: 9.3 mg/dL (ref 8.6–10.4)
Chloride: 103 mmol/L (ref 98–110)
Creat: 0.97 mg/dL (ref 0.50–1.03)
Globulin: 2.6 g/dL (calc) (ref 1.9–3.7)
Glucose, Bld: 83 mg/dL (ref 65–99)
Potassium: 4.2 mmol/L (ref 3.5–5.3)
Sodium: 139 mmol/L (ref 135–146)
Total Bilirubin: 1 mg/dL (ref 0.2–1.2)
Total Protein: 7.1 g/dL (ref 6.1–8.1)
eGFR: 69 mL/min/{1.73_m2} (ref >=60)

## 2022-08-03 LAB — LIPID PANEL
Cholesterol: 207 mg/dL — ABNORMAL HIGH (ref <200)
HDL: 80 mg/dL (ref >=50)
LDL Cholesterol (Calc): 113 mg/dL (calc) — ABNORMAL HIGH
Non-HDL Cholesterol (Calc): 127 mg/dL (calc) (ref <130)
Total CHOL/HDL Ratio: 2.6 (calc) (ref <5.0)
Triglycerides: 62 mg/dL (ref <150)

## 2022-08-03 LAB — VITAMIN D 25 HYDROXY (VIT D DEFICIENCY, FRACTURES): Vit D, 25-Hydroxy: 25 ng/mL — ABNORMAL LOW (ref 30–100)

## 2022-08-03 LAB — HEMOGLOBIN A1C
Hgb A1c MFr Bld: 5.7 % of total Hgb — ABNORMAL HIGH (ref <5.7)
Mean Plasma Glucose: 117 mg/dL
eAG (mmol/L): 6.5 mmol/L

## 2022-08-04 ENCOUNTER — Ambulatory Visit
Admission: RE | Admit: 2022-08-04 | Discharge: 2022-08-04 | Disposition: A | Discharge status: Home / Self Care | Payer: Federal, State, Local not specified - PPO | Source: Ambulatory Visit | Attending: Family Medicine | Admitting: Family Medicine

## 2022-08-04 DIAGNOSIS — R928 Other abnormal and inconclusive findings on diagnostic imaging of breast: Secondary | ICD-10-CM | POA: Insufficient documentation

## 2022-08-04 DIAGNOSIS — N63 Unspecified lump in unspecified breast: Secondary | ICD-10-CM | POA: Insufficient documentation

## 2022-08-04 DIAGNOSIS — R922 Inconclusive mammogram: Secondary | ICD-10-CM | POA: Diagnosis not present

## 2022-12-18 ENCOUNTER — Other Ambulatory Visit: Payer: Self-pay | Admitting: Family Medicine

## 2022-12-18 DIAGNOSIS — A6000 Herpesviral infection of urogenital system, unspecified: Secondary | ICD-10-CM

## 2023-01-31 NOTE — Progress Notes (Deleted)
Name: Tammy Evans   MRN: [id number]    DOB: 09-07-1970   Date:01/31/2023       Progress Note  Subjective  Chief Complaint  Follow up  HPI  Menopause: she stopped having cycles since Feb 2021- she states hot flashes and night sweats are mild now , stable symptoms   Migraine headaches: She continues to have migraines intermittently about twice a month . Certain things like different smells and lack of sleep trigger her migraines. She works third shift .Imitrex works well for her , she needs to take a nap after she takes medication, we will try Nurtec and it may help her stay at work during an episode . She states not missing a lot of days lately but needs forms filled out .   Pre-diabetes: last A1C was 5.7% last visit, she likes sweets and has difficulty cutting on carbohydrates. She is physically active We will recheck labs. There is a family history of diabetes on both sides. She denies polyphagia, polydipsia or polyuria   Vitamin D deficiency: she states that she forgets to take medication once a week, we will change to vitamin D otc 2000 units daily We will recheck labs today   Low white count: stable it goes up and down, used to have low platelets but that has resolved.  We will recheck labs   Insomnia/works 3 rd shift: only taking prn Trazodone prn , usually sleeps well, unless when she is stressed out   Herpes genitalis: she needs a refill of valtrex, she only takes it prn, she takes medications as soon as episodes starts to avoid complete outbreak   Patient Active Problem List   Diagnosis Date Noted   Menopausal vasomotor syndrome 12/08/2021   Vitamin D deficiency 12/08/2021   Cervical polyp 01/29/2021   Knee pain, left 12/06/2019   Pre-diabetes 01/01/2018   Major depression in remission ([facility]) 12/27/2017   Other neutropenia ([facility]) 08/08/2016   Genital herpes 08/08/2016   GAD (generalized anxiety disorder) 11/10/2015   Migraine without aura and responsive to treatment  01/13/2015   Other insomnia 01/13/2015    Past Surgical History:  Procedure Laterality Date   CESAREAN SECTION     COLONOSCOPY WITH PROPOFOL N/A 02/28/2020   Procedure: COLONOSCOPY WITH PROPOFOL;  Surgeon: Midge Minium, MD;  Location: [facility];  Service: Endoscopy;  Laterality: N/A;  priority 4   culposcopy     TUBAL LIGATION      Family History  Problem Relation Age of Onset   Diabetes Father    Heart disease Father    Hypertension Father    Drug abuse Sister    Sickle cell trait Daughter    Breast cancer Neg Hx     Social History   Tobacco Use   Smoking status: Never   Smokeless tobacco: Never  Substance Use Topics   Alcohol use: Yes    Alcohol/week: 0.0 standard drinks of alcohol    Comment: occasionally (1x/month)     Current Outpatient Medications:    Cholecalciferol (VITAMIN D3) 50 MCG (2000 UT) capsule, Take 1 capsule (2,000 Units total) by mouth daily., Disp: 100 capsule, Rfl: 1   Rimegepant Sulfate (NURTEC) 75 MG TBDP, Take 1 tablet (75 mg total) by mouth daily as needed., Disp: 16 tablet, Rfl: 0   SUMAtriptan (IMITREX) 100 MG tablet, Take 1 tablet (100 mg total) by mouth every 2 (two) hours as needed for migraine., Disp: 10 tablet, Rfl: 2   traZODone (DESYREL) 50 MG tablet,  Take 1 tablet (50 mg total) by mouth at bedtime as needed for sleep., Disp: 90 tablet, Rfl: 1   valACYclovir (VALTREX) 500 MG tablet, TAKE 1 TABLET (500 MG TOTAL) BY MOUTH DAILY. TAKE 1 TABLET (500 MG TOTAL) BY MOUTH DAILY. TAKE 3 TIMES A DAY FOR OUTBREAKS, Disp: 110 tablet, Rfl: 1  Allergies  Allergen Reactions   Macrobid [Nitrofurantoin]     headache    I personally reviewed {Reviewed:[id number]} with the patient/caregiver today.   ROS  ***  Objective  There were no vitals filed for this visit.  There is no height or weight on file to calculate BMI.  Physical Exam ***  No results found for this or any previous visit (from the past 2160 hour(s)).  Diabetic Foot  Exam: Diabetic Foot Exam - Simple   No data filed    ***  PHQ2/9:    08/02/2022    3:12 PM 04/27/2022    3:18 PM 01/31/2022    3:41 PM 12/08/2021    1:23 PM 05/07/2021    3:10 PM  Depression screen PHQ 2/9  Decreased Interest 0 0 0 0 0  Down, Depressed, Hopeless 0 0 0 0 0  PHQ - 2 Score 0 0 0 0 0  Altered sleeping 0 0 0 0 0  Tired, decreased energy 0 0 0 0 0  Change in appetite 0 0 0 0 0  Feeling bad or failure about yourself  0 0 0 0 0  Trouble concentrating 0 0 0 0 0  Moving slowly or fidgety/restless 0 0 0 0 0  Suicidal thoughts 0 0 0 0 0  PHQ-9 Score 0 0 0 0 0  Difficult doing work/chores  Not difficult at all  Not difficult at all Not difficult at all    phq 9 is {gen pos [id number]} ***  Fall Risk:    08/02/2022    3:12 PM 04/27/2022    3:18 PM 01/31/2022    3:42 PM 12/08/2021    1:23 PM 05/07/2021    3:10 PM  Fall Risk   Falls in the past year? 0 0 0 0 0  Number falls in past yr: 0 0  0 0  Injury with Fall? 0 0  0 0  Risk for fall due to : No Fall Risks No Fall Risks No Fall Risks No Fall Risks No Fall Risks  Follow up Falls prevention discussed Falls prevention discussed;Education provided;Falls evaluation completed Falls prevention discussed;Education provided;Falls evaluation completed Falls prevention discussed;Education provided Falls prevention discussed   ***   Functional Status Survey:   ***   Assessment & Plan  *** There are no diagnoses linked to this encounter.

## 2023-02-01 ENCOUNTER — Ambulatory Visit: Payer: Federal, State, Local not specified - PPO | Admitting: Family Medicine

## 2023-02-01 DIAGNOSIS — Z23 Encounter for immunization: Secondary | ICD-10-CM

## 2023-02-02 NOTE — Progress Notes (Signed)
Name: Tammy Evans   MRN: [id number]    DOB: 05-17-1970   Date:02/03/2023       Progress Note  Subjective  Chief Complaint  Annual Exam  HPI  Patient presents for annual CPE.  Diet: balanced diet Exercise:  she has a physical job , she also walks 30 minutes per day  Last Eye Exam: up to date Last Dental Exam: up to date   [employer] Visit from 02/03/2023 in [facility]  AUDIT-C Score 2      Depression: Phq 9 is  negative    02/03/2023    3:06 PM 08/02/2022    3:12 PM 04/27/2022    3:18 PM 01/31/2022    3:41 PM 12/08/2021    1:23 PM  Depression screen PHQ 2/9  Decreased Interest 0 0 0 0 0  Down, Depressed, Hopeless 0 0 0 0 0  PHQ - 2 Score 0 0 0 0 0  Altered sleeping 0 0 0 0 0  Tired, decreased energy 0 0 0 0 0  Change in appetite 0 0 0 0 0  Feeling bad or failure about yourself  0 0 0 0 0  Trouble concentrating 0 0 0 0 0  Moving slowly or fidgety/restless 0 0 0 0 0  Suicidal thoughts 0 0 0 0 0  PHQ-9 Score 0 0 0 0 0  Difficult doing work/chores   Not difficult at all  Not difficult at all   Hypertension: BP Readings from Last 3 Encounters:  02/03/23 122/70  08/02/22 120/78  04/27/22 126/72   Obesity: Wt Readings from Last 3 Encounters:  02/03/23 156 lb (70.8 kg)  08/02/22 158 lb (71.7 kg)  04/27/22 157 lb 8 oz (71.4 kg)   BMI Readings from Last 3 Encounters:  02/03/23 25.96 kg/m  08/02/22 27.99 kg/m  04/27/22 25.42 kg/m     Vaccines:    Tdap: up to date Shingrix: up to date Pneumonia: N/A Flu: refused  COVID-19: 1 J&J   Hep C Screening: 12/09/19 STD testing and prevention (HIV/chl/gon/syphilis): 11/03/20 Intimate partner violence: negative screen  Sexual History : no pain or discomfort , condoms, one partner  Menstrual History/LMP/Abnormal Bleeding: post menopausal about 2 years  Discussed importance of follow up if any post-menopausal bleeding: yes  Incontinence Symptoms: negative for symptoms    Breast cancer:  - Last Mammogram: 07/27/22 - BRCA gene screening: N/A  Osteoporosis Prevention : Discussed high calcium and vitamin D supplementation, weight bearing exercises Bone density: N/A   Cervical cancer screening: 01/29/21 - not sure if partner is cheating we will check   Skin cancer: Discussed monitoring for atypical lesions  Colorectal cancer: 02/28/20   Lung cancer:  Low Dose CT Chest recommended if Age 15-80 years, 20 pack-year currently smoking OR have quit w/in 15years. Patient does not qualify for screen   ECG: N/A  Advanced Care Planning: A voluntary discussion about advance care planning including the explanation and discussion of advance directives.  Discussed health care proxy and Living will, and the patient was able to identify a health care proxy as wife  .  Patient does not have a living will and power of attorney of health care   Lipids: Lab Results  Component Value Date   CHOL 207 (H) 08/02/2022   CHOL 213 (H) 11/03/2020   CHOL 193 12/09/2019   Lab Results  Component Value Date   HDL 80 08/02/2022   HDL 76 11/03/2020   HDL 70 12/09/2019  Lab Results  Component Value Date   LDLCALC 113 (H) 08/02/2022   LDLCALC 119 (H) 11/03/2020   LDLCALC 100 (H) 12/09/2019   Lab Results  Component Value Date   TRIG 62 08/02/2022   TRIG 79 11/03/2020   TRIG 129 12/09/2019   Lab Results  Component Value Date   CHOLHDL 2.6 08/02/2022   CHOLHDL 2.8 11/03/2020   CHOLHDL 2.8 12/09/2019   No results found for: "LDLDIRECT"  Glucose: Glucose, Bld  Date Value Ref Range Status  08/02/2022 83 65 - 99 mg/dL Final    Comment:    .            Fasting reference interval .   05/07/2021 83 65 - 99 mg/dL Final    Comment:    .            Fasting reference interval .   11/03/2020 85 65 - 99 mg/dL Final    Comment:    .            Fasting reference interval .     Patient Active Problem List   Diagnosis Date Noted   Menopausal vasomotor syndrome  12/08/2021   Vitamin D deficiency 12/08/2021   Cervical polyp 01/29/2021   Knee pain, left 12/06/2019   Pre-diabetes 01/01/2018   Major depression in remission (HCC) 12/27/2017   Other neutropenia (HCC) 08/08/2016   Genital herpes 08/08/2016   GAD (generalized anxiety disorder) 11/10/2015   Migraine without aura and responsive to treatment 01/13/2015   Other insomnia 01/13/2015    Past Surgical History:  Procedure Laterality Date   CESAREAN SECTION     COLONOSCOPY WITH PROPOFOL N/A 02/28/2020   Procedure: COLONOSCOPY WITH PROPOFOL;  Surgeon: Midge Minium, MD;  Location: [facility];  Service: Endoscopy;  Laterality: N/A;  priority 4   culposcopy     TUBAL LIGATION      Family History  Problem Relation Age of Onset   Diabetes Father    Heart disease Father    Hypertension Father    Drug abuse Sister    Sickle cell trait Daughter    Breast cancer Neg Hx     Social History   Socioeconomic History   Marital status: Single    Spouse name: Not on file   Number of children: 2   Years of education: Not on file   Highest education level: Not on file  Occupational History   Occupation: postal service employee  Tobacco Use   Smoking status: Never   Smokeless tobacco: Never  Vaping Use   Vaping status: Never Used  Substance and Sexual Activity   Alcohol use: Yes    Alcohol/week: 0.0 standard drinks of alcohol    Comment: occasionally (1x/month)   Drug use: No   Sexual activity: Yes    Birth control/protection: Condom    Comment: Tubal Ligation   Other Topics Concern   Not on file  Social History Narrative   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   She is living alone now.  She has grown children from previous relationship   Social Determinants of Health   Financial Resource Strain: Low Risk  (02/03/2023)   Overall Financial Resource Strain (CARDIA)    Difficulty of Paying Living Expenses: Not hard at all  Food Insecurity: No Food Insecurity (02/03/2023)    Hunger Vital Sign    Worried About Running Out of Food in the Last Year: Never true    Ran Out of Food in  the Last Year: Never true  Transportation Needs: No Transportation Needs (01/31/2022)   PRAPARE - Administrator, Civil Service (Medical): No    Lack of Transportation (Non-Medical): No  Physical Activity: Insufficiently Active (02/03/2023)   Exercise Vital Sign    Days of Exercise per Week: 4 days    Minutes of Exercise per Session: 30 min  Stress: No Stress Concern Present (02/03/2023)   [employer] of Occupational Health - Occupational Stress Questionnaire    Feeling of Stress : Only a little  Social Connections: Unknown (01/31/2022)   Social Connection and Isolation Panel [NHANES]    Frequency of Communication with Friends and Family: More than three times a week    Frequency of Social Gatherings with Friends and Family: Once a week    Attends Religious Services: More than 4 times per year    Active Member of [employer] or Organizations: Yes    Attends Banker Meetings: More than 4 times per year    Marital Status: Not on file  Intimate Partner Violence: Not At Risk (02/03/2023)   Humiliation, Afraid, Rape, and Kick questionnaire    Fear of Current or Ex-Partner: No    Emotionally Abused: No    Physically Abused: No    Sexually Abused: No     Current Outpatient Medications:    Cholecalciferol (VITAMIN D3) 50 MCG (2000 UT) capsule, Take 1 capsule (2,000 Units total) by mouth daily., Disp: 100 capsule, Rfl: 1   Rimegepant Sulfate (NURTEC) 75 MG TBDP, Take 1 tablet (75 mg total) by mouth daily as needed., Disp: 16 tablet, Rfl: 0   SUMAtriptan (IMITREX) 100 MG tablet, Take 1 tablet (100 mg total) by mouth every 2 (two) hours as needed for migraine., Disp: 10 tablet, Rfl: 2   traZODone (DESYREL) 50 MG tablet, Take 1 tablet (50 mg total) by mouth at bedtime as needed for sleep., Disp: 90 tablet, Rfl: 1   valACYclovir (VALTREX) 500 MG tablet, TAKE 1  TABLET (500 MG TOTAL) BY MOUTH DAILY. TAKE 1 TABLET (500 MG TOTAL) BY MOUTH DAILY. TAKE 3 TIMES A DAY FOR OUTBREAKS, Disp: 110 tablet, Rfl: 1  Allergies  Allergen Reactions   Macrobid [Nitrofurantoin]     headache     ROS  Constitutional: Negative for fever or weight change.  Respiratory: Negative for cough and shortness of breath.   Cardiovascular: Negative for chest pain or palpitations.  Gastrointestinal: Negative for abdominal pain, no bowel changes.  Musculoskeletal: Negative for gait problem or joint swelling.  Skin: Negative for rash.  Neurological: Negative for dizziness or headache.  No other specific complaints in a complete review of systems (except as listed in HPI above).   Objective  Vitals:   02/03/23 1506  BP: 122/70  Pulse: 84  Resp: 16  SpO2: 100%  Weight: 156 lb (70.8 kg)  Height: 5\' 5"  (1.651 m)    Body mass index is 25.96 kg/m.  Physical Exam  Constitutional: Patient appears well-developed and well-nourished. No distress.  HENT: Head: Normocephalic and atraumatic. Ears: B TMs ok, no erythema or effusion; Nose: Nose normal. Mouth/Throat: Oropharynx is clear and moist. No oropharyngeal exudate.  Eyes: Conjunctivae and EOM are normal. Pupils are equal, round, and reactive to light. No scleral icterus.  Neck: Normal range of motion. Neck supple. No JVD present. No thyromegaly present.  Cardiovascular: Normal rate, regular rhythm and normal heart sounds.  No murmur heard. No BLE edema. Pulmonary/Chest: Effort normal and breath sounds normal. No respiratory  distress. Abdominal: Soft. Bowel sounds are normal, no distension. There is no tenderness. no masses Breast: no lumps or masses, no nipple discharge or rashes FEMALE GENITALIA:  External genitalia normal External urethra normal Vaginal vault normal without discharge or lesions Cervix normal without discharge or lesions Bimanual exam normal without masses RECTAL: not done  Musculoskeletal: Normal  range of motion, no joint effusions. No gross deformities Neurological: he is alert and oriented to person, place, and time. No cranial nerve deficit. Coordination, balance, strength, speech and gait are normal.  Skin: Skin is warm and dry. No rash noted. No erythema.  Psychiatric: Patient has a normal mood and affect. behavior is normal. Judgment and thought content normal.   Fall Risk:    02/03/2023    3:06 PM 08/02/2022    3:12 PM 04/27/2022    3:18 PM 01/31/2022    3:42 PM 12/08/2021    1:23 PM  Fall Risk   Falls in the past year? 0 0 0 0 0  Number falls in past yr: 0 0 0  0  Injury with Fall? 0 0 0  0  Risk for fall due to : No Fall Risks No Fall Risks No Fall Risks No Fall Risks No Fall Risks  Follow up Falls prevention discussed Falls prevention discussed Falls prevention discussed;Education provided;Falls evaluation completed Falls prevention discussed;Education provided;Falls evaluation completed Falls prevention discussed;Education provided     Functional Status Survey: Is the patient deaf or have difficulty hearing?: No Does the patient have difficulty seeing, even when wearing glasses/contacts?: No Does the patient have difficulty concentrating, remembering, or making decisions?: No Does the patient have difficulty walking or climbing stairs?: No Does the patient have difficulty dressing or bathing?: No Does the patient have difficulty doing errands alone such as visiting a doctor's office or shopping?: No   Assessment & Plan   1. Well adult exam  - Cytology - PAP - RPR - HIV Antibody (routine testing w rflx) - Lipid panel - Hemoglobin A1c - VITAMIN D 25 Hydroxy (Vit-D Deficiency, Fractures) - B12 and Folate Panel - CBC with Differential/Platelet - COMPLETE METABOLIC PANEL WITH GFR  2. Breast cancer screening by mammogram  - MM 3D SCREENING MAMMOGRAM BILATERAL BREAST; Future  3. Screening examination for STI  - Cytology - PAP - RPR - HIV Antibody  (routine testing w rflx)  4. Vitamin D deficiency  - VITAMIN D 25 Hydroxy (Vit-D Deficiency, Fractures)  5. Pre-diabetes  - Hemoglobin A1c  6. Lipid screening  Lipid panel   7. Cervical cancer screening  - Cytology - PAP  8. B12 deficiency  - B12 and Folate Panel - CBC with Differential/Platelet    -USPSTF grade A and B recommendations reviewed with patient; age-appropriate recommendations, preventive care, screening tests, etc discussed and encouraged; healthy living encouraged; see AVS for patient education given to patient -Discussed importance of 150 minutes of physical activity weekly, eat two servings of fish weekly, eat one serving of tree nuts ( cashews, pistachios, pecans, almonds.Marland Kitchen) every other day, eat 6 servings of fruit/vegetables daily and drink plenty of water and avoid sweet beverages.   -Reviewed Health Maintenance: Yes.

## 2023-02-03 ENCOUNTER — Encounter: Payer: Self-pay | Admitting: Family Medicine

## 2023-02-03 ENCOUNTER — Ambulatory Visit (INDEPENDENT_AMBULATORY_CARE_PROVIDER_SITE_OTHER): Payer: Federal, State, Local not specified - PPO | Admitting: Family Medicine

## 2023-02-03 ENCOUNTER — Other Ambulatory Visit (HOSPITAL_COMMUNITY)
Admission: RE | Admit: 2023-02-03 | Discharge: 2023-02-03 | Disposition: A | Discharge status: Home / Self Care | Payer: Federal, State, Local not specified - PPO | Source: Ambulatory Visit | Attending: Family Medicine | Admitting: Family Medicine

## 2023-02-03 VITALS — BP 122/70 | HR 84 | Resp 16 | Ht 65.0 in | Wt 156.0 lb

## 2023-02-03 DIAGNOSIS — Z113 Encounter for screening for infections with a predominantly sexual mode of transmission: Secondary | ICD-10-CM | POA: Insufficient documentation

## 2023-02-03 DIAGNOSIS — Z124 Encounter for screening for malignant neoplasm of cervix: Secondary | ICD-10-CM

## 2023-02-03 DIAGNOSIS — E785 Hyperlipidemia, unspecified: Secondary | ICD-10-CM | POA: Diagnosis not present

## 2023-02-03 DIAGNOSIS — E559 Vitamin D deficiency, unspecified: Secondary | ICD-10-CM

## 2023-02-03 DIAGNOSIS — R7303 Prediabetes: Secondary | ICD-10-CM | POA: Diagnosis not present

## 2023-02-03 DIAGNOSIS — Z1231 Encounter for screening mammogram for malignant neoplasm of breast: Secondary | ICD-10-CM | POA: Diagnosis not present

## 2023-02-03 DIAGNOSIS — E538 Deficiency of other specified B group vitamins: Secondary | ICD-10-CM

## 2023-02-03 DIAGNOSIS — Z0000 Encounter for general adult medical examination without abnormal findings: Secondary | ICD-10-CM

## 2023-02-03 DIAGNOSIS — Z13220 Encounter for screening for lipoid disorders: Secondary | ICD-10-CM

## 2023-02-04 LAB — COMPLETE METABOLIC PANEL WITH GFR
AG Ratio: 1.7 (calc) (ref 1.0–2.5)
ALT: 17 U/L (ref 6–29)
AST: 20 U/L (ref 10–35)
Albumin: 4.5 g/dL (ref 3.6–5.1)
Alkaline phosphatase (APISO): 94 U/L (ref 37–153)
BUN: 15 mg/dL (ref 7–25)
CO2: 30 mmol/L (ref 20–32)
Calcium: 9.3 mg/dL (ref 8.6–10.4)
Chloride: 103 mmol/L (ref 98–110)
Creat: 1.03 mg/dL (ref 0.50–1.03)
Globulin: 2.7 g/dL (ref 1.9–3.7)
Glucose, Bld: 73 mg/dL (ref 65–99)
Potassium: 4 mmol/L (ref 3.5–5.3)
Sodium: 140 mmol/L (ref 135–146)
Total Bilirubin: 1.3 mg/dL — ABNORMAL HIGH (ref 0.2–1.2)
Total Protein: 7.2 g/dL (ref 6.1–8.1)
eGFR: 65 mL/min/{1.73_m2} (ref >=60)

## 2023-02-04 LAB — B12 AND FOLATE PANEL
Folate: 16.2 ng/mL
Vitamin B-12: 583 pg/mL (ref 200–1100)

## 2023-02-04 LAB — HEMOGLOBIN A1C
Hgb A1c MFr Bld: 5.7 %{Hb} — ABNORMAL HIGH (ref <5.7)
Mean Plasma Glucose: 117 mg/dL
eAG (mmol/L): 6.5 mmol/L

## 2023-02-04 LAB — CBC WITH DIFFERENTIAL/PLATELET
Absolute Lymphocytes: 750 {cells}/uL — ABNORMAL LOW (ref 850–3900)
Absolute Monocytes: 307 {cells}/uL (ref 200–950)
Basophils Absolute: 31 {cells}/uL (ref 0–200)
Basophils Relative: 1 %
Eosinophils Absolute: 50 {cells}/uL (ref 15–500)
Eosinophils Relative: 1.6 %
HCT: 40.8 % (ref 35.0–45.0)
Hemoglobin: 13.3 g/dL (ref 11.7–15.5)
MCH: 30.2 pg (ref 27.0–33.0)
MCHC: 32.6 g/dL (ref 32.0–36.0)
MCV: 92.5 fL (ref 80.0–100.0)
MPV: 12.9 fL — ABNORMAL HIGH (ref 7.5–12.5)
Monocytes Relative: 9.9 %
Neutro Abs: 1962 {cells}/uL (ref 1500–7800)
Neutrophils Relative %: 63.3 %
Platelets: 178 10*3/uL (ref 140–400)
RBC: 4.41 10*6/uL (ref 3.80–5.10)
RDW: 13.6 % (ref 11.0–15.0)
Total Lymphocyte: 24.2 %
WBC: 3.1 10*3/uL — ABNORMAL LOW (ref 3.8–10.8)

## 2023-02-04 LAB — LIPID PANEL
Cholesterol: 226 mg/dL — ABNORMAL HIGH (ref <200)
HDL: 69 mg/dL (ref >=50)
LDL Cholesterol (Calc): 140 mg/dL — ABNORMAL HIGH
Non-HDL Cholesterol (Calc): 157 mg/dL — ABNORMAL HIGH (ref <130)
Total CHOL/HDL Ratio: 3.3 (calc) (ref <5.0)
Triglycerides: 76 mg/dL (ref <150)

## 2023-02-04 LAB — VITAMIN D 25 HYDROXY (VIT D DEFICIENCY, FRACTURES): Vit D, 25-Hydroxy: 21 ng/mL — ABNORMAL LOW (ref 30–100)

## 2023-02-04 LAB — HIV ANTIBODY (ROUTINE TESTING W REFLEX): HIV 1&2 Ab, 4th Generation: NONREACTIVE

## 2023-02-04 LAB — RPR: RPR Ser Ql: NONREACTIVE

## 2023-02-07 LAB — CYTOLOGY - PAP
Chlamydia: NEGATIVE
Comment: NEGATIVE
Comment: NEGATIVE
Comment: NORMAL
Diagnosis: NEGATIVE
High risk HPV: POSITIVE — AB
Neisseria Gonorrhea: NEGATIVE

## 2023-03-03 NOTE — Progress Notes (Unsigned)
Name: Tammy Evans   MRN: [id number]    DOB: [birth year]   Date:03/06/2023       Progress Note  Subjective  Chief Complaint  Mole Check  HPI  Discussed the use of AI scribe software for clinical note transcription with the patient, who gave verbal consent to proceed.  History of Present Illness   The patient presented with a new mole on the clavicle that has been growing over the past year. The mole, measuring approximately 6.5 by 2 millimeters, has not been associated with any itching or other discomfort. However, the patient expressed concern due to its growth and newness.  In addition to the mole, the patient reported difficulty sleeping and requested a refill of Trazodone, which had been effective in the past. The patient also reported experiencing migraines approximately twice a month, triggered by strong scents, lack of sleep, and stress. The patient preferred Imitrex for managing these migraines, as it allowed her to sleep.  The patient also discussed her prediabetes and hyperlipidemia. She reported making dietary changes, such as reducing intake of cheese and pasta and increasing consumption of fruits, nuts, and berries. Despite these changes, the patient's cholesterol had increased from 113 to 140. The patient also reported a chronic low white blood cell count, known as chronic neutropenia.  Lastly, the patient mentioned a prescription for Valtrex for type 2 herpes, but did not request a refill at this time. She also reported frequent urination, but did not consider this a new symptom.         Patient Active Problem List   Diagnosis Date Noted   Menopausal vasomotor syndrome 12/08/2021   Vitamin D deficiency 12/08/2021   Cervical polyp 01/29/2021   Knee pain, left 12/06/2019   Pre-diabetes 01/01/2018   Major depression in remission (HCC) 12/27/2017   Other neutropenia (HCC) 08/08/2016   Genital herpes 08/08/2016   GAD (generalized anxiety disorder) 11/10/2015    Migraine without aura and responsive to treatment 01/13/2015   Other insomnia 01/13/2015    Past Surgical History:  Procedure Laterality Date   CESAREAN SECTION     COLONOSCOPY WITH PROPOFOL N/A 02/28/2020   Procedure: COLONOSCOPY WITH PROPOFOL;  Surgeon: Midge Minium, MD;  Location: [facility];  Service: Endoscopy;  Laterality: N/A;  priority 4   culposcopy     TUBAL LIGATION      Family History  Problem Relation Age of Onset   Diabetes Father    Heart disease Father    Hypertension Father    Drug abuse Sister    Sickle cell trait Daughter    Breast cancer Neg Hx     Social History   Tobacco Use   Smoking status: Never   Smokeless tobacco: Never  Substance Use Topics   Alcohol use: Yes    Alcohol/week: 0.0 standard drinks of alcohol    Comment: occasionally (1x/month)     Current Outpatient Medications:    Cholecalciferol (VITAMIN D3) 50 MCG (2000 UT) capsule, Take 1 capsule (2,000 Units total) by mouth daily., Disp: 100 capsule, Rfl: 1   SUMAtriptan (IMITREX) 100 MG tablet, Take 1 tablet (100 mg total) by mouth every 2 (two) hours as needed for migraine., Disp: 10 tablet, Rfl: 2   traZODone (DESYREL) 50 MG tablet, Take 1 tablet (50 mg total) by mouth at bedtime as needed for sleep., Disp: 90 tablet, Rfl: 1   valACYclovir (VALTREX) 500 MG tablet, TAKE 1 TABLET (500 MG TOTAL) BY MOUTH DAILY. TAKE 1 TABLET (500 MG  TOTAL) BY MOUTH DAILY. TAKE 3 TIMES A DAY FOR OUTBREAKS, Disp: 110 tablet, Rfl: 1  Allergies  Allergen Reactions   Macrobid [Nitrofurantoin]     headache    I personally reviewed active problem list, medication list, allergies, family history, social history, health maintenance with the patient/caregiver today.   ROS  Ten systems reviewed and is negative except as mentioned in HPI    Objective  Vitals:   03/06/23 1525  BP: 122/74  Pulse: 78  Resp: 14  Temp: 97.6 F (36.4 C)  TempSrc: Oral  SpO2: 99%  Weight: 156 lb 12.8 oz (71.1 kg)   Height: 5\' 9"  (1.753 m)    Body mass index is 23.16 kg/m.  Physical Exam  Physical Exam   VITALS: Blood pressure within normal limits, weight stable compared to last month. SKIN: Lesion on clavicle measuring 6.5 by 2 millimeters, brown, raised, non-pruritic, consistent with an age spot.         Constitutional: Patient appears well-developed and well-nourished. Obese  No distress.  HEENT: head atraumatic, normocephalic,neck supple, throat within normal limits Cardiovascular: Normal rate, regular rhythm and normal heart sounds.  No murmur heard. No BLE edema. Pulmonary/Chest: Effort normal and breath sounds normal. No respiratory distress. Abdominal: Soft.  There is no tenderness. Psychiatric: Patient has a normal mood and affect. behavior is normal. Judgment and thought content normal.    PHQ2/9:    03/06/2023    3:26 PM 02/03/2023    3:06 PM 08/02/2022    3:12 PM 04/27/2022    3:18 PM 01/31/2022    3:41 PM  Depression screen PHQ 2/9  Decreased Interest 0 0 0 0 0  Down, Depressed, Hopeless 0 0 0 0 0  PHQ - 2 Score 0 0 0 0 0  Altered sleeping 0 0 0 0 0  Tired, decreased energy 0 0 0 0 0  Change in appetite 0 0 0 0 0  Feeling bad or failure about yourself  0 0 0 0 0  Trouble concentrating 0 0 0 0 0  Moving slowly or fidgety/restless 0 0 0 0 0  Suicidal thoughts 0 0 0 0 0  PHQ-9 Score 0 0 0 0 0  Difficult doing work/chores    Not difficult at all     phq 9 is negative   Fall Risk:    03/06/2023    3:26 PM 02/03/2023    3:06 PM 08/02/2022    3:12 PM 04/27/2022    3:18 PM 01/31/2022    3:42 PM  Fall Risk   Falls in the past year? 0 0 0 0 0  Number falls in past yr:  0 0 0   Injury with Fall?  0 0 0   Risk for fall due to : No Fall Risks No Fall Risks No Fall Risks No Fall Risks No Fall Risks  Follow up Falls prevention discussed Falls prevention discussed Falls prevention discussed Falls prevention discussed;Education provided;Falls evaluation completed Falls  prevention discussed;Education provided;Falls evaluation completed     Functional Status Survey: Is the patient deaf or have difficulty hearing?: No Does the patient have difficulty seeing, even when wearing glasses/contacts?: No Does the patient have difficulty concentrating, remembering, or making decisions?: No Does the patient have difficulty walking or climbing stairs?: No Does the patient have difficulty dressing or bathing?: No Does the patient have difficulty doing errands alone such as visiting a doctor's office or shopping?: No    Assessment & Plan  Assessment and Plan  Skin Lesion New mole on clavicle, present for approximately one year and growing. No itching. Size approximately 6.49mm by 2mm. No concerning features on visual inspection, but due to growth and new onset, biopsy recommended. -Perform shave biopsy today and send to pathology for evaluation.  Insomnia Reports difficulty sleeping. Previously managed with Trazodone with good effect. -Refill Trazodone prescription.  Hyperlipidemia LDL cholesterol elevated at 140. Patient reports dietary changes including increased intake of fruits, nuts, berries, and salads, and decreased intake of cheese and pasta. -Continue dietary modifications and increase physical activity.  Prediabetes A1c at 5.7. Patient reports dietary modifications. -Continue dietary modifications and increase physical activity.  Migraine Reports frontal pressure during episodes, approximately twice a month. Triggers include strong scents, lack of sleep, and stress. Imitrex effective for symptom management. -Refill Imitrex prescription.  Vitamin D Deficiency Reports taking over-the-counter Vitamin D supplements. -Continue Vitamin D supplements at 2000 units daily.  Chronic Neutropenia Stable, no intervention required at this time.  Follow-up Await pathology results from skin biopsy. Continue current management for other conditions.        Consent signed: YES  Procedure: Skin Mass Removal Location: right clavicle   Equipment used: derma-blade, high temperature cautery Anesthesia: 1% Lidocaine w/o Epinephrine  Cleaned and prepped: Betadine  After verbal consent signed, are of skin prepped with betadine. Lidocaine w/o epinephrine injected into skin underneath skin mass. After properly numbed sterile equipment used to remove tag.  Specimen sent for pathology analysis. Instructed on proper care to allow for proper healing. F/U for nursing visit if needed.

## 2023-03-06 ENCOUNTER — Encounter: Payer: Self-pay | Admitting: Family Medicine

## 2023-03-06 ENCOUNTER — Other Ambulatory Visit (HOSPITAL_COMMUNITY)
Admission: RE | Admit: 2023-03-06 | Discharge: 2023-03-06 | Disposition: A | Discharge status: Home / Self Care | Payer: Federal, State, Local not specified - PPO | Source: Ambulatory Visit | Attending: Family Medicine | Admitting: Family Medicine

## 2023-03-06 ENCOUNTER — Ambulatory Visit: Payer: Federal, State, Local not specified - PPO | Admitting: Family Medicine

## 2023-03-06 VITALS — BP 122/74 | HR 78 | Temp 97.6°F | Resp 14 | Ht 69.0 in | Wt 156.8 lb

## 2023-03-06 DIAGNOSIS — L821 Other seborrheic keratosis: Secondary | ICD-10-CM | POA: Diagnosis not present

## 2023-03-06 DIAGNOSIS — R7303 Prediabetes: Secondary | ICD-10-CM | POA: Diagnosis not present

## 2023-03-06 DIAGNOSIS — L989 Disorder of the skin and subcutaneous tissue, unspecified: Secondary | ICD-10-CM

## 2023-03-06 DIAGNOSIS — G4709 Other insomnia: Secondary | ICD-10-CM

## 2023-03-06 DIAGNOSIS — E7800 Pure hypercholesterolemia, unspecified: Secondary | ICD-10-CM | POA: Diagnosis not present

## 2023-03-06 DIAGNOSIS — D485 Neoplasm of uncertain behavior of skin: Secondary | ICD-10-CM

## 2023-03-06 DIAGNOSIS — G43009 Migraine without aura, not intractable, without status migrainosus: Secondary | ICD-10-CM

## 2023-03-06 MED ORDER — LIDOCAINE HCL (PF) 1 % IJ SOLN
2.0000 mL | Freq: Once | INTRAMUSCULAR | Status: AC
Start: 2023-03-06 — End: 2023-03-06
  Administered 2023-03-06: 2 mL via INTRADERMAL

## 2023-03-06 MED ORDER — TRAZODONE HCL 50 MG PO TABS
50.0000 mg | ORAL_TABLET | Freq: Every evening | ORAL | 1 refills | Status: DC | PRN
Start: 2023-03-06 — End: 2023-08-16

## 2023-03-06 MED ORDER — SUMATRIPTAN SUCCINATE 100 MG PO TABS
100.0000 mg | ORAL_TABLET | ORAL | 2 refills | Status: DC | PRN
Start: 2023-03-06 — End: 2023-08-16

## 2023-03-08 LAB — SURGICAL PATHOLOGY

## 2023-07-02 DIAGNOSIS — J069 Acute upper respiratory infection, unspecified: Secondary | ICD-10-CM | POA: Diagnosis not present

## 2023-07-02 DIAGNOSIS — J040 Acute laryngitis: Secondary | ICD-10-CM | POA: Diagnosis not present

## 2023-07-02 DIAGNOSIS — R0981 Nasal congestion: Secondary | ICD-10-CM | POA: Diagnosis not present

## 2023-07-28 ENCOUNTER — Ambulatory Visit
Admission: RE | Admit: 2023-07-28 | Discharge: 2023-07-28 | Disposition: A | Discharge status: Home / Self Care | Source: Ambulatory Visit | Attending: Family Medicine | Admitting: Family Medicine

## 2023-07-28 DIAGNOSIS — Z1231 Encounter for screening mammogram for malignant neoplasm of breast: Secondary | ICD-10-CM | POA: Insufficient documentation

## 2023-08-14 ENCOUNTER — Ambulatory Visit: Payer: Self-pay | Admitting: Family Medicine

## 2023-08-16 ENCOUNTER — Ambulatory Visit: Admitting: Family Medicine

## 2023-08-16 ENCOUNTER — Encounter: Payer: Self-pay | Admitting: Family Medicine

## 2023-08-16 VITALS — BP 132/86 | HR 77 | Resp 16 | Ht 69.0 in | Wt 162.7 lb

## 2023-08-16 DIAGNOSIS — E7800 Pure hypercholesterolemia, unspecified: Secondary | ICD-10-CM

## 2023-08-16 DIAGNOSIS — R7303 Prediabetes: Secondary | ICD-10-CM

## 2023-08-16 DIAGNOSIS — G44209 Tension-type headache, unspecified, not intractable: Secondary | ICD-10-CM | POA: Insufficient documentation

## 2023-08-16 DIAGNOSIS — G43009 Migraine without aura, not intractable, without status migrainosus: Secondary | ICD-10-CM

## 2023-08-16 DIAGNOSIS — G4709 Other insomnia: Secondary | ICD-10-CM

## 2023-08-16 DIAGNOSIS — E538 Deficiency of other specified B group vitamins: Secondary | ICD-10-CM | POA: Insufficient documentation

## 2023-08-16 DIAGNOSIS — D708 Other neutropenia: Secondary | ICD-10-CM | POA: Diagnosis not present

## 2023-08-16 DIAGNOSIS — E559 Vitamin D deficiency, unspecified: Secondary | ICD-10-CM

## 2023-08-16 MED ORDER — SUMATRIPTAN SUCCINATE 100 MG PO TABS
100.0000 mg | ORAL_TABLET | ORAL | 2 refills | Status: AC | PRN
Start: 2023-08-16 — End: ?

## 2023-08-16 MED ORDER — TRAZODONE HCL 50 MG PO TABS: 50.0000 mg | ORAL_TABLET | Freq: Every evening | ORAL | 1 refills | Status: DC | PRN

## 2023-08-16 NOTE — Progress Notes (Signed)
 Name: Tammy Evans   MRN: [id number]    DOB: 01/31/1971   Date:08/16/2023       Progress Note  Subjective  Chief Complaint  Chief Complaint  Patient presents with   Medical Management of Chronic Issues    Migraine headaches: She continues to have migraines intermittently about twice a month . Certain things like different smells and lack of sleep trigger her migraines. She works third shift .Imitrex  works well for her , she needs to take a nap after she takes medication, we discussed Nurtec but states lately most of her headaches are dull like on top of her head, mild and improves with tylenol . She states not sleep well since she was not able to fill rx of Trazodone  even though it was sent to her pharmacy last Fall    Pre-diabetes: last A1C was 5.7% last visit, she likes sweets and has difficulty cutting on carbohydrates. She states also not very physically active lately . There is a family history of diabetes on both sides. She denies polyphagia, polydipsia or polyuria    Hyperlipidemia , ASCVD is low but due to sister having recent stroke she is willing to recheck labs and discuss medication if necessary  Vitamin D  deficiency:taking supplements  Neutropenia  stable it goes up and down, used to have low platelets but that has resolved.    Insomnia/works 3 rd shift:she has been out of Trazodone  and not sleeping well lately     Herpes genitalis: she needs a refill of valtrex , she only takes it prn, she takes medications as soon as episodes starts and has not had a full outbreak in a while      Past Surgical History:  Procedure Laterality Date   CESAREAN SECTION     COLONOSCOPY WITH PROPOFOL  N/A 02/28/2020   Procedure: COLONOSCOPY WITH PROPOFOL ;  Surgeon: Marnee Sink, MD;  Location: [facility];  Service: Endoscopy;  Laterality: N/A;  priority 4   culposcopy     TUBAL LIGATION      Family History  Problem Relation Age of Onset   Diabetes Father    Heart disease  Father    Hypertension Father    Drug abuse Sister    Stroke Sister 28   Sickle cell trait Daughter    Breast cancer Neg Hx     Social History   Tobacco Use   Smoking status: Never   Smokeless tobacco: Never  Substance Use Topics   Alcohol use: Yes    Alcohol/week: 0.0 standard drinks of alcohol    Comment: occasionally (1x/month)     Current Outpatient Medications:    Cholecalciferol (VITAMIN D3) 50 MCG (2000 UT) capsule, Take 1 capsule (2,000 Units total) by mouth daily., Disp: 100 capsule, Rfl: 1   valACYclovir  (VALTREX ) 500 MG tablet, TAKE 1 TABLET (500 MG TOTAL) BY MOUTH DAILY. TAKE 1 TABLET (500 MG TOTAL) BY MOUTH DAILY. TAKE 3 TIMES A DAY FOR OUTBREAKS, Disp: 110 tablet, Rfl: 1   SUMAtriptan  (IMITREX ) 100 MG tablet, Take 1 tablet (100 mg total) by mouth every 2 (two) hours as needed for migraine., Disp: 10 tablet, Rfl: 2   traZODone  (DESYREL ) 50 MG tablet, Take 1 tablet (50 mg total) by mouth at bedtime as needed for sleep., Disp: 90 tablet, Rfl: 1  Allergies  Allergen Reactions   Macrobid [Nitrofurantoin]     headache    I personally reviewed active problem list, medication list, allergies with the patient/caregiver today.   ROS  Ten systems  reviewed and is negative except as mentioned in HPI    Objective Physical Exam   Vitals:   08/16/23 0847  BP: 132/86  Pulse: 77  Resp: 16  SpO2: 97%  Weight: 162 lb 11.2 oz (73.8 kg)  Height: 5\' 9"  (1.753 m)    Body mass index is 24.03 kg/m.    PHQ2/9:    08/16/2023    8:49 AM 08/16/2023    8:41 AM 03/06/2023    3:26 PM 02/03/2023    3:06 PM 08/02/2022    3:12 PM  Depression screen PHQ 2/9  Decreased Interest 0 0 0 0 0  Down, Depressed, Hopeless 0 0 0 0 0  PHQ - 2 Score 0 0 0 0 0  Altered sleeping 0  0 0 0  Tired, decreased energy 0  0 0 0  Change in appetite 0  0 0 0  Feeling bad or failure about yourself  0  0 0 0  Trouble concentrating 0  0 0 0  Moving slowly or fidgety/restless 0  0 0 0  Suicidal  thoughts 0  0 0 0  PHQ-9 Score 0  0 0 0  Difficult doing work/chores Not difficult at all        phq 9 is negative  Fall Risk:    08/16/2023    8:41 AM 03/06/2023    3:26 PM 02/03/2023    3:06 PM 08/02/2022    3:12 PM 04/27/2022    3:18 PM  Fall Risk   Falls in the past year? 0 0 0 0 0  Number falls in past yr: 0  0 0 0  Injury with Fall? 0  0 0 0  Risk for fall due to : No Fall Risks No Fall Risks No Fall Risks No Fall Risks No Fall Risks  Follow up Falls prevention discussed;Education provided;Falls evaluation completed Falls prevention discussed Falls prevention discussed Falls prevention discussed Falls prevention discussed;Education provided;Falls evaluation completed   Assessment and Plan:  1. Hypercholesterolemia (Primary)  She is worried about level since her younger sister recently had a stroke - Lipoprotein Fractionation, NMR with Lipid Panel (Triglycerides/HDL-C)  2. Pre-diabetes  Reminded her of healthier diet and cutting down on carbohydrates  3. Other neutropenia (HCC)  - Ambulatory referral to Hematology / Oncology It has been stable over the years but patient would like to be evaluated  4. Migraine without aura and responsive to treatment  - SUMAtriptan  (IMITREX ) 100 MG tablet; Take 1 tablet (100 mg total) by mouth every 2 (two) hours as needed for migraine.  Dispense: 10 tablet; Refill: 2 Episodes more frequent lately but has been out of trazodone  and has not been sleeping well   5. Other insomnia  - traZODone  (DESYREL ) 50 MG tablet; Take 1 tablet (50 mg total) by mouth at bedtime as needed for sleep.  Dispense: 90 tablet; Refill: 1  6. Vitamin D  deficiency  Continue supplements  7. B12 deficiency  Discussed supplementation   8. Tension headache  Hopefully it will improve once she starts to sleep better again, return sooner if no improvement after she resumes taking Trazodone  for sleep

## 2023-08-19 LAB — LIPOPROTEIN FRACTIONATION, NMR W/ LIPID PNL
CHOL/HDL C: 2.9 calc (ref <5.0)
CHOLESTEROL, TOTAL: 207 mg/dL — ABNORMAL HIGH (ref <200)
HDL CHOLESTEROL: 72 mg/dL (ref >49)
HDL P: 50 umol/L (ref >32.8)
HDL Size: 9.2 nm (ref >9.0)
LARGE HDL P: 10.8 umol/L (ref >7.2)
LARGE VLDL P: 2.3 nmol/L (ref <3.7)
LDL CHOLESTEROL: 120 mg/dL — ABNORMAL HIGH (ref <100)
LDL P: 1537 nmol/L — ABNORMAL HIGH (ref <935)
LDL SIZE: 21.6 nm (ref >20.5)
NON HDL CHOLESTEROL: 135 mg/dL — ABNORMAL HIGH (ref <130)
SMALL LDL P: 199 nmol/L (ref <467)
TG/HDL C: 0.9 calc (ref <2.0)
TRIGLYCERIDES: 62 mg/dL (ref <150)
VLDL Size: 43.4 nm (ref <47.1)

## 2023-08-21 ENCOUNTER — Other Ambulatory Visit: Payer: Self-pay | Admitting: Family Medicine

## 2023-08-21 ENCOUNTER — Encounter: Payer: Self-pay | Admitting: Family Medicine

## 2023-08-21 DIAGNOSIS — E7800 Pure hypercholesterolemia, unspecified: Secondary | ICD-10-CM

## 2023-08-21 MED ORDER — ROSUVASTATIN CALCIUM 10 MG PO TABS
10.0000 mg | ORAL_TABLET | Freq: Every day | ORAL | 1 refills | Status: DC
Start: 2023-08-21 — End: 2024-02-07

## 2023-08-22 ENCOUNTER — Inpatient Hospital Stay

## 2023-08-22 ENCOUNTER — Inpatient Hospital Stay: Attending: Oncology | Admitting: Oncology

## 2023-08-22 ENCOUNTER — Encounter: Payer: Self-pay | Admitting: Oncology

## 2023-08-22 VITALS — BP 116/85 | HR 70 | Temp 97.0°F | Resp 18 | Wt 160.1 lb

## 2023-08-22 DIAGNOSIS — D72810 Lymphocytopenia: Secondary | ICD-10-CM | POA: Diagnosis not present

## 2023-08-22 LAB — CBC WITH DIFFERENTIAL/PLATELET
Abs Immature Granulocytes: 0.01 10*3/uL (ref 0.00–0.07)
Basophils Absolute: 0 10*3/uL (ref 0.0–0.1)
Basophils Relative: 1 %
Eosinophils Absolute: 0.1 10*3/uL (ref 0.0–0.5)
Eosinophils Relative: 2 %
HCT: 39.5 % (ref 36.0–46.0)
Hemoglobin: 13.4 g/dL (ref 12.0–15.0)
Immature Granulocytes: 0 %
Lymphocytes Relative: 20 %
Lymphs Abs: 0.8 10*3/uL (ref 0.7–4.0)
MCH: 30.5 pg (ref 26.0–34.0)
MCHC: 33.9 g/dL (ref 30.0–36.0)
MCV: 89.8 fL (ref 80.0–100.0)
Monocytes Absolute: 0.3 10*3/uL (ref 0.1–1.0)
Monocytes Relative: 7 %
Neutro Abs: 2.8 10*3/uL (ref 1.7–7.7)
Neutrophils Relative %: 70 %
Platelets: 180 10*3/uL (ref 150–400)
RBC: 4.4 MIL/uL (ref 3.87–5.11)
RDW: 12.9 % (ref 11.5–15.5)
WBC: 4 10*3/uL (ref 4.0–10.5)
nRBC: 0 % (ref 0.0–0.2)

## 2023-08-22 LAB — C-REACTIVE PROTEIN: CRP: 0.5 mg/dL (ref <1.0)

## 2023-08-22 LAB — TECHNOLOGIST SMEAR REVIEW
Plt Morphology: NORMAL
RBC MORPHOLOGY: NORMAL
WBC MORPHOLOGY: NORMAL

## 2023-08-22 LAB — HEPATIC FUNCTION PANEL
ALT: 21 U/L (ref 0–44)
AST: 22 U/L (ref 15–41)
Albumin: 4.6 g/dL (ref 3.5–5.0)
Alkaline Phosphatase: 89 U/L (ref 38–126)
Bilirubin, Direct: 0.2 mg/dL (ref 0.0–0.2)
Indirect Bilirubin: 1.3 mg/dL — ABNORMAL HIGH (ref 0.3–0.9)
Total Bilirubin: 1.5 mg/dL — ABNORMAL HIGH (ref 0.0–1.2)
Total Protein: 7.7 g/dL (ref 6.5–8.1)

## 2023-08-22 LAB — HEPATITIS PANEL, ACUTE
HCV Ab: NONREACTIVE
Hep A IgM: NONREACTIVE
Hep B C IgM: NONREACTIVE
Hepatitis B Surface Ag: NONREACTIVE

## 2023-08-22 LAB — VITAMIN B12: Vitamin B-12: 3171 pg/mL — ABNORMAL HIGH (ref 180–914)

## 2023-08-22 LAB — LACTATE DEHYDROGENASE: LDH: 184 U/L (ref 98–192)

## 2023-08-22 LAB — SEDIMENTATION RATE: Sed Rate: 7 mm/h (ref 0–30)

## 2023-08-22 LAB — FOLATE: Folate: 15.4 ng/mL (ref >5.9)

## 2023-08-22 NOTE — Assessment & Plan Note (Signed)
 Primarily indirect hyperbilirubinemia.  Chronic condition for her. Monitor.  No need for intervention

## 2023-08-22 NOTE — Progress Notes (Signed)
 Hematology/Oncology Consult note [phone] [phone]        REFERRING PROVIDER: Sowles, Krichna, MD   CHIEF COMPLAINTS/REASON FOR VISIT:  Evaluation of leukopenia   ASSESSMENT & PLAN:   Lymphocytopenia Chronic lymphopenia. Reactive versus due to underlying bone marrow disorders. Check CBC, smear, B12, folate, protein electrophoresis, light chain ratio, flow cytometry, LDH, hepatitis panel, ANA, ESR, CRP, EBV, CMV.  Hyperbilirubinemia Primarily indirect hyperbilirubinemia.  Chronic condition for her. Monitor.  No need for intervention   Orders Placed This Encounter  Procedures   CBC with Differential/Platelet    Standing Status:   Future    Number of Occurrences:   1    Expected Date:   08/22/2023    Expiration Date:   08/21/2024   Multiple Myeloma Panel (SPEP&IFE w/QIG)    Standing Status:   Future    Number of Occurrences:   1    Expected Date:   08/22/2023    Expiration Date:   08/21/2024   Kappa/lambda light chains    Standing Status:   Future    Number of Occurrences:   1    Expected Date:   08/22/2023    Expiration Date:   08/21/2024   Flow cytometry panel-leukemia/lymphoma work-up    Standing Status:   Future    Number of Occurrences:   1    Expected Date:   08/22/2023    Expiration Date:   08/21/2024   Lactate dehydrogenase    Standing Status:   Future    Number of Occurrences:   1    Expected Date:   08/22/2023    Expiration Date:   08/21/2024   Hepatitis panel, acute    Standing Status:   Future    Number of Occurrences:   1    Expected Date:   08/22/2023    Expiration Date:   08/21/2024   ANA, IFA (with reflex)    Standing Status:   Future    Number of Occurrences:   1    Expected Date:   08/22/2023    Expiration Date:   08/21/2024   Vitamin B12    Standing Status:   Future    Number of Occurrences:   1    Expected Date:   08/22/2023    Expiration Date:   08/21/2024   Folate    Standing Status:   Future    Number of Occurrences:    1    Expected Date:   08/22/2023    Expiration Date:   08/21/2024   Hepatic function panel    Standing Status:   Future    Number of Occurrences:   1    Expected Date:   08/22/2023    Expiration Date:   08/21/2024   Technologist smear review    Standing Status:   Future    Number of Occurrences:   1    Expected Date:   08/22/2023    Expiration Date:   08/21/2024    Clinical information::   lymphocytopenia   Sedimentation rate    Standing Status:   Future    Number of Occurrences:   1    Expected Date:   08/22/2023    Expiration Date:   08/21/2024   C-reactive protein    Standing Status:   Future    Number of Occurrences:   1    Expected Date:   08/22/2023    Expiration Date:   08/21/2024   Epstein barr vrs(ebv dna by pcr)  Standing Status:   Future    Number of Occurrences:   1    Expected Date:   08/22/2023    Expiration Date:   08/21/2024   CMV DNA, quantitative, PCR    Standing Status:   Future    Number of Occurrences:   1    Expected Date:   08/22/2023    Expiration Date:   08/21/2024  Follow-up in few weeks to review results All questions were answered. The patient knows to call the clinic with any problems, questions or concerns.  [name], MD, PhD [facility] Hematology Oncology 08/22/2023   HISTORY OF PRESENTING ILLNESS:   Ahlaam Harton is a  53 y.o.  female with PMH listed below was seen in consultation at the request of  Sowles, Krichna, MD  for evaluation of leukopenia I reviewed patient's previous lab records. She has a chronic history lymphocytopenia for several years, characterized by consistently low white blood cell counts and fluctuations in lymphocyte levels. Her vitamin B12 and folate levels were normal six months ago. The most recent blood work, conducted six months ago, showed a similar decrease in total white blood cell count, particularly in lymphocytes with absolute lymphocyte of 700.Aaron Aas  No frequent infections, including shingles, and she has  received the shingles vaccine. She had COVID-19 and bronchitis last year but otherwise feels healthy. No unintentional weight loss, excessive night sweats, fever, or loss of appetite. She maintains a good appetite.  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 She experiences coughing and sneezing at work, attributing these symptoms to the work environment.  She denies taking any over-the-counter supplements or herbal treatments. She experiences knee stiffness but no joint pain in her fingers or other small joints. No skin rashes, non-healing wounds, or any foreign body implants, except for an asymptomatic dental implant.  She has recently traveled to [place] the [place], and [place] but denies any cough symptoms or other travel-related health issues.   MEDICAL HISTORY:  Past Medical History:  Diagnosis Date   Chronic insomnia    Headache, common migraine    4-5 x/yr   Menopausal state     SURGICAL HISTORY: Past Surgical History:  Procedure Laterality Date   CESAREAN SECTION     COLONOSCOPY WITH PROPOFOL  N/A 02/28/2020   Procedure: COLONOSCOPY WITH PROPOFOL ;  Surgeon: Marnee Sink, MD;  Location: [facility];  Service: Endoscopy;  Laterality: N/A;  priority 4   culposcopy     TUBAL LIGATION      SOCIAL HISTORY: Social History   Socioeconomic History   Marital status: Single    Spouse name: Not on file   Number of children: 2   Years of education: Not on file   Highest education level: Not on file  Occupational History   Occupation: postal service employee  Tobacco Use   Smoking status: Never   Smokeless tobacco: Never  Vaping Use   Vaping status: Never Used  Substance and Sexual Activity   Alcohol use: Yes    Alcohol/week: 0.0 standard drinks of alcohol    Comment: occasionally (1x/month)   Drug use: No   Sexual activity: Yes    Birth control/protection: Condom    Comment:  Tubal Ligation   Other Topics Concern   Not on file  Social History Narrative   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   She is living alone now.  She has  grown children from previous relationship   Social Drivers of Corporate investment banker Strain: Low Risk  (02/03/2023)   Overall Financial Resource Strain (CARDIA)    Difficulty of Paying Living Expenses: Not hard at all  Food Insecurity: No Food Insecurity (08/22/2023)   Hunger Vital Sign    Worried About Running Out of Food in the Last Year: Never true    Ran Out of Food in the Last Year: Never true  Transportation Needs: No Transportation Needs (08/22/2023)   PRAPARE - Administrator, Civil Service (Medical): No    Lack of Transportation (Non-Medical): No  Physical Activity: Insufficiently Active (02/03/2023)   Exercise Vital Sign    Days of Exercise per Week: 4 days    Minutes of Exercise per Session: 30 min  Stress: No Stress Concern Present (02/03/2023)   [employer] of Occupational Health - Occupational Stress Questionnaire    Feeling of Stress : Only a little  Social Connections: Unknown (01/31/2022)   Social Connection and Isolation Panel [NHANES]    Frequency of Communication with Friends and Family: More than three times a week    Frequency of Social Gatherings with Friends and Family: Once a week    Attends Religious Services: More than 4 times per year    Active Member of [employer] or Organizations: Yes    Attends Engineer, structural: More than 4 times per year    Marital Status: Not on file  Intimate Partner Violence: Not At Risk (08/22/2023)   Humiliation, Afraid, Rape, and Kick questionnaire    Fear of Current or Ex-Partner: No    Emotionally Abused: No    Physically Abused: No    Sexually Abused: No    FAMILY HISTORY: Family History  Problem Relation Age of Onset   Hypertension Mother    Diabetes Mother    Hyperlipidemia Mother    Diabetes Father    Heart disease Father     Hypertension Father    Drug abuse Sister    Stroke Sister 50   Heart murmur Brother    Sickle cell trait Daughter    Breast cancer Neg Hx     ALLERGIES:  is allergic to macrobid [nitrofurantoin].  MEDICATIONS:  Current Outpatient Medications  Medication Sig Dispense Refill   Cholecalciferol (VITAMIN D3) 50 MCG (2000 UT) capsule Take 1 capsule (2,000 Units total) by mouth daily. 100 capsule 1   rosuvastatin (CRESTOR) 10 MG tablet Take 1 tablet (10 mg total) by mouth daily. 90 tablet 1   SUMAtriptan  (IMITREX ) 100 MG tablet Take 1 tablet (100 mg total) by mouth every 2 (two) hours as needed for migraine. 10 tablet 2   traZODone  (DESYREL ) 50 MG tablet Take 1 tablet (50 mg total) by mouth at bedtime as needed for sleep. 90 tablet 1   valACYclovir  (VALTREX ) 500 MG tablet TAKE 1 TABLET (500 MG TOTAL) BY MOUTH DAILY. TAKE 1 TABLET (500 MG TOTAL) BY MOUTH DAILY. TAKE 3 TIMES A DAY FOR OUTBREAKS (Patient not taking: Reported on 08/22/2023) 110 tablet 1   No current facility-administered medications for this visit.    Review of Systems  Constitutional:  Negative for appetite change, chills, fatigue and fever.  HENT:   Negative for hearing loss and voice change.   Eyes:  Negative for eye problems.  Respiratory:  Negative for chest tightness and cough.   Cardiovascular:  Negative for chest pain.  Gastrointestinal:  Negative for abdominal distention, abdominal pain and blood in stool.  Endocrine: Negative for hot flashes.  Genitourinary:  Negative for difficulty urinating and frequency.   Musculoskeletal:  Negative for arthralgias.  Skin:  Negative for itching and rash.  Neurological:  Negative for extremity weakness.  Hematological:  Negative for adenopathy.  Psychiatric/Behavioral:  Negative for confusion.    PHYSICAL EXAMINATION: ECOG PERFORMANCE STATUS: 0 - Asymptomatic Vitals:   08/22/23 1547  BP: 116/85  Pulse: 70  Resp: 18  Temp: (!) 97 F (36.1 C)   Filed Weights   08/22/23  1547  Weight: 160 lb 1.6 oz (72.6 kg)    Physical Exam Constitutional:      General: She is not in acute distress. HENT:     Head: Normocephalic and atraumatic.  Eyes:     General: No scleral icterus. Cardiovascular:     Rate and Rhythm: Normal rate and regular rhythm.     Heart sounds: Normal heart sounds.  Pulmonary:     Effort: Pulmonary effort is normal. No respiratory distress.     Breath sounds: No wheezing.  Abdominal:     General: Bowel sounds are normal. There is no distension.     Palpations: Abdomen is soft.  Musculoskeletal:        General: No deformity. Normal range of motion.     Cervical back: Normal range of motion and neck supple.  Skin:    General: Skin is warm and dry.     Findings: No erythema or rash.  Neurological:     Mental Status: She is alert and oriented to person, place, and time. Mental status is at baseline.  Psychiatric:        Mood and Affect: Mood normal.     LABORATORY DATA:  I have reviewed the data as listed    Latest Ref Rng & Units 08/22/2023    4:14 PM 02/03/2023    3:47 PM 08/02/2022    3:46 PM  CBC  WBC 4.0 - 10.5 K/uL 4.0  3.1  3.4   Hemoglobin 12.0 - 15.0 g/dL 86.5  78.4  69.6   Hematocrit 36.0 - 46.0 % 39.5  40.8  38.4   Platelets 150 - 400 K/uL 180  178  167       Latest Ref Rng & Units 08/22/2023    4:14 PM 02/03/2023    3:47 PM 08/02/2022    3:46 PM  CMP  Glucose 65 - 99 mg/dL  73  83   BUN 7 - 25 mg/dL  15  14   Creatinine 2.95 - 1.03 mg/dL  2.84  1.32   Sodium 440 - 146 mmol/L  140  139   Potassium 3.5 - 5.3 mmol/L  4.0  4.2   Chloride 98 - 110 mmol/L  103  103   CO2 20 - 32 mmol/L  30  29   Calcium 8.6 - 10.4 mg/dL  9.3  9.3   Total Protein 6.5 - 8.1 g/dL 7.7  7.2  7.1   Total Bilirubin 0.0 - 1.2 mg/dL 1.5  1.3  1.0   Alkaline Phos 38 - 126 U/L 89     AST 15 - 41 U/L 22  20  18    ALT 0 - 44 U/L 21  17  15        RADIOGRAPHIC STUDIES: I have personally reviewed the radiological images as listed and  agreed with the findings in the report. MM 3D SCREENING MAMMOGRAM BILATERAL BREAST Result Date: 08/02/2023 CLINICAL DATA:  Screening. EXAM: DIGITAL SCREENING BILATERAL MAMMOGRAM WITH  TOMOSYNTHESIS AND CAD TECHNIQUE: Bilateral screening digital craniocaudal and mediolateral oblique mammograms were obtained. Bilateral screening digital breast tomosynthesis was performed. The images were evaluated with computer-aided detection. COMPARISON:  Previous exam(s). ACR Breast Density Category c: The breasts are heterogeneously dense, which may obscure small masses. FINDINGS: There are no findings suspicious for malignancy. IMPRESSION: No mammographic evidence of malignancy. A result letter of this screening mammogram will be mailed directly to the patient. RECOMMENDATION: Screening mammogram in one year. (Code:SM-B-01Y) BI-RADS CATEGORY  1: Negative. Electronically Signed   By: Sande Cromer M.D.   On: 08/02/2023 08:34

## 2023-08-22 NOTE — Assessment & Plan Note (Addendum)
 Chronic lymphopenia. Reactive versus due to underlying bone marrow disorders. Check CBC, smear, B12, folate, protein electrophoresis, light chain ratio, flow cytometry, LDH, hepatitis panel, ANA, ESR, CRP, EBV, CMV.

## 2023-08-23 LAB — KAPPA/LAMBDA LIGHT CHAINS
Kappa free light chain: 16.6 mg/L (ref 3.3–19.4)
Kappa, lambda light chain ratio: 1.5 (ref 0.26–1.65)
Lambda free light chains: 11.1 mg/L (ref 5.7–26.3)

## 2023-08-23 LAB — CMV DNA, QUANTITATIVE, PCR
CMV DNA Quant: NEGATIVE [IU]/mL
Log10 CMV Qn DNA Pl: UNDETERMINED {Log_IU}/mL

## 2023-08-23 LAB — EPSTEIN BARR VRS(EBV DNA BY PCR): EBV DNA QN by PCR: NEGATIVE [IU]/mL

## 2023-08-25 LAB — COMP PANEL: LEUKEMIA/LYMPHOMA

## 2023-08-25 LAB — MULTIPLE MYELOMA PANEL, SERUM
Albumin SerPl Elph-Mcnc: 3.9 g/dL (ref 2.9–4.4)
Albumin/Glob SerPl: 1.3 (ref 0.7–1.7)
Alpha 1: 0.2 g/dL (ref 0.0–0.4)
Alpha2 Glob SerPl Elph-Mcnc: 0.6 g/dL (ref 0.4–1.0)
B-Globulin SerPl Elph-Mcnc: 1.1 g/dL (ref 0.7–1.3)
Gamma Glob SerPl Elph-Mcnc: 1.4 g/dL (ref 0.4–1.8)
Globulin, Total: 3.2 g/dL (ref 2.2–3.9)
IgA: 127 mg/dL (ref 87–352)
IgG (Immunoglobin G), Serum: 1250 mg/dL (ref 586–1602)
IgM (Immunoglobulin M), Srm: 123 mg/dL (ref 26–217)
Total Protein ELP: 7.1 g/dL (ref 6.0–8.5)

## 2023-08-31 LAB — FANA STAINING PATTERNS: Speckled Pattern: 1:1280 {titer} — ABNORMAL HIGH

## 2023-08-31 LAB — ANTINUCLEAR ANTIBODIES, IFA: ANA Ab, IFA: POSITIVE — AB

## 2023-09-13 ENCOUNTER — Ambulatory Visit: Payer: Self-pay | Admitting: Family Medicine

## 2023-09-20 ENCOUNTER — Encounter: Payer: Self-pay | Admitting: Oncology

## 2023-09-20 ENCOUNTER — Inpatient Hospital Stay: Attending: Oncology | Admitting: Oncology

## 2023-09-20 VITALS — BP 142/88 | HR 83 | Temp 96.0°F | Resp 18 | Wt 164.4 lb

## 2023-09-20 DIAGNOSIS — R768 Other specified abnormal immunological findings in serum: Secondary | ICD-10-CM | POA: Insufficient documentation

## 2023-09-20 DIAGNOSIS — D72810 Lymphocytopenia: Secondary | ICD-10-CM | POA: Diagnosis not present

## 2023-09-20 NOTE — Assessment & Plan Note (Signed)
Refer to rheumatology for further evaluation

## 2023-09-20 NOTE — Progress Notes (Signed)
 Hematology/Oncology Progress note [phone] [phone]           REFERRING PROVIDER: Sowles, Krichna, MD   CHIEF COMPLAINTS/REASON FOR VISIT:  Leukopenia   ASSESSMENT & PLAN:   Lymphocytopenia Chronic lymphopenia. Workup results were reviewed with patient. CBC showed no lymphocytopenia.  Adequate B12, folate levels.  No M protein on protein electrophoresis.  Normal light chain ratio.  Negative flow cytometry.  Normal LDH.  Negative hepatitis panel, ESR, CRP.  Negative EBV and CMV. Positive ANA, titer 1:1280 Underlying autoimmune disease may contribute to the chronic lymphocytopenia.    Hyperbilirubinemia Primarily indirect hyperbilirubinemia.  Chronic condition for her. Monitor.  No need for intervention   Orders Placed This Encounter  Procedures   Ambulatory referral to Rheumatology    Referral Priority:   Routine    Referral Type:   Consultation    Referral Reason:   Specialty Services Required    Requested Specialty:   Rheumatology    Number of Visits Requested:   1  Follow-up as needed All questions were answered. The patient knows to call the clinic with any problems, questions or concerns.  Tammy Forbes, MD, PhD [facility] Hematology Oncology 09/20/2023   HISTORY OF PRESENTING ILLNESS:   Tammy Evans is a  53 y.o.  female with PMH listed below was seen in consultation at the request of  Sowles, Krichna, MD  for evaluation of leukopenia I reviewed patient's previous lab records. She has a chronic history lymphocytopenia for several years, characterized by consistently low white blood cell counts and fluctuations in lymphocyte levels. Her vitamin B12 and folate levels were normal six months ago. The most recent blood work, conducted six months ago, showed a similar decrease in total white blood cell count, particularly in lymphocytes with absolute lymphocyte of 700.Tammy Evans  No frequent infections, including shingles, and she has received the  shingles vaccine. She had COVID-19 and bronchitis last year but otherwise feels healthy. No unintentional weight loss, excessive night sweats, fever, or loss of appetite. She maintains a good appetite.  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 She experiences coughing and sneezing at work, attributing these symptoms to the work environment.  She denies taking any over-the-counter supplements or herbal treatments. She experiences knee stiffness but no joint pain in her fingers or other small joints. No skin rashes, non-healing wounds, or any foreign body implants, except for an asymptomatic dental implant.  She has recently traveled to [place] the [place], and [place] but denies any cough symptoms or other travel-related health issues.  INTERVAL HISTORY Tammy Evans is a 53 y.o. female who has above history reviewed by me today presents for follow up visit for leukopenia. Patient presents to discuss results.  No new complaints. She denies chronic joint pain, stiffness, skin rash or thickening. She feels thirsty often, she carries water  bottle with her.   MEDICAL HISTORY:  Past Medical History:  Diagnosis Date   Chronic insomnia    Headache, common migraine    4-5 x/yr   Menopausal state     SURGICAL HISTORY: Past Surgical History:  Procedure Laterality Date   CESAREAN SECTION     COLONOSCOPY WITH PROPOFOL  N/A 02/28/2020   Procedure: COLONOSCOPY WITH PROPOFOL ;  Surgeon: Tammy Sink, MD;  Location[facility];  Service: Endoscopy;  Laterality: N/A;  priority 4   culposcopy     TUBAL LIGATION  SOCIAL HISTORY: Social History   Socioeconomic History   Marital status: Single    Spouse name: Not on file   Number of children: 2   Years of education: Not on file   Highest education level: Not on file  Occupational History   Occupation: postal service employee  Tobacco Use    Smoking status: Never   Smokeless tobacco: Never  Vaping Use   Vaping status: Never Used  Substance and Sexual Activity   Alcohol use: Yes    Alcohol/week: 0.0 standard drinks of alcohol    Comment: occasionally (1x/month)   Drug use: No   Sexual activity: Yes    Birth control/protection: Condom    Comment: Tubal Ligation   Other Topics Concern   Not on file  Social History Narrative   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   She is living alone now.  She has grown children from previous relationship   Social Drivers of Corporate investment banker Strain: Low Risk  (02/03/2023)   Overall Financial Resource Strain (CARDIA)    Difficulty of Paying Living Expenses: Not hard at all  Food Insecurity: No Food Insecurity (08/22/2023)   Hunger Vital Sign    Worried About Running Out of Food in the Last Year: Never true    Ran Out of Food in the Last Year: Never true  Transportation Needs: No Transportation Needs (08/22/2023)   PRAPARE - Administrator, Civil Service (Medical): No    Lack of Transportation (Non-Medical): No  Physical Activity: Insufficiently Active (02/03/2023)   Exercise Vital Sign    Days of Exercise per Week: 4 days    Minutes of Exercise per Session: 30 min  Stress: No Stress Concern Present (02/03/2023)   [employer] of Occupational Health - Occupational Stress Questionnaire    Feeling of Stress : Only a little  Social Connections: Unknown (01/31/2022)   Social Connection and Isolation Panel [NHANES]    Frequency of Communication with Friends and Family: More than three times a week    Frequency of Social Gatherings with Friends and Family: Once a week    Attends Religious Services: More than 4 times per year    Active Member of [employer] or Organizations: Yes    Attends Engineer, structural: More than 4 times per year    Marital Status: Not on file  Intimate Partner Violence: Not At Risk (08/22/2023)   Humiliation, Afraid, Rape, and  Kick questionnaire    Fear of Current or Ex-Partner: No    Emotionally Abused: No    Physically Abused: No    Sexually Abused: No    FAMILY HISTORY: Family History  Problem Relation Age of Onset   Hypertension Mother    Diabetes Mother    Hyperlipidemia Mother    Diabetes Father    Heart disease Father    Hypertension Father    Drug abuse Sister    Stroke Sister 50   Heart murmur Brother    Sickle cell trait Daughter    Breast cancer Neg Hx     ALLERGIES:  is allergic to macrobid [nitrofurantoin].  MEDICATIONS:  Current Outpatient Medications  Medication Sig Dispense Refill   Cholecalciferol (VITAMIN D3) 50 MCG (2000 UT) capsule Take 1 capsule (2,000 Units total) by mouth daily. 100 capsule 1   rosuvastatin  (CRESTOR ) 10 MG tablet Take 1 tablet (10 mg total) by mouth daily. 90 tablet 1   SUMAtriptan  (IMITREX ) 100 MG tablet Take 1 tablet (100  mg total) by mouth every 2 (two) hours as needed for migraine. 10 tablet 2   traZODone  (DESYREL ) 50 MG tablet Take 1 tablet (50 mg total) by mouth at bedtime as needed for sleep. 90 tablet 1   valACYclovir  (VALTREX ) 500 MG tablet TAKE 1 TABLET (500 MG TOTAL) BY MOUTH DAILY. TAKE 1 TABLET (500 MG TOTAL) BY MOUTH DAILY. TAKE 3 TIMES A DAY FOR OUTBREAKS (Patient not taking: Reported on 09/20/2023) 110 tablet 1   No current facility-administered medications for this visit.    Review of Systems  Constitutional:  Negative for appetite change, chills, fatigue and fever.  HENT:   Negative for hearing loss and voice change.   Eyes:  Negative for eye problems.  Respiratory:  Negative for chest tightness and cough.   Cardiovascular:  Negative for chest pain.  Gastrointestinal:  Negative for abdominal distention, abdominal pain and blood in stool.  Endocrine: Negative for hot flashes.  Genitourinary:  Negative for difficulty urinating and frequency.   Musculoskeletal:  Negative for arthralgias.  Skin:  Negative for itching and rash.   Neurological:  Negative for extremity weakness.  Hematological:  Negative for adenopathy.  Psychiatric/Behavioral:  Negative for confusion.    PHYSICAL EXAMINATION: ECOG PERFORMANCE STATUS: 0 - Asymptomatic Vitals:   09/20/23 1444 09/20/23 1458  BP: (!) 139/103 (!) 142/88  Pulse: 83   Resp: 18   Temp: (!) 96 F (35.6 C)   SpO2: 100%    Filed Weights   09/20/23 1444  Weight: 164 lb 6.4 oz (74.6 kg)    Physical Exam Constitutional:      General: She is not in acute distress. HENT:     Head: Normocephalic and atraumatic.  Eyes:     General: No scleral icterus. Cardiovascular:     Rate and Rhythm: Normal rate.  Pulmonary:     Effort: Pulmonary effort is normal. No respiratory distress.  Abdominal:     General: There is no distension.  Musculoskeletal:        General: No deformity. Normal range of motion.     Cervical back: Normal range of motion and neck supple.  Skin:    Findings: No rash.  Neurological:     Mental Status: She is alert and oriented to person, place, and time. Mental status is at baseline.  Psychiatric:        Mood and Affect: Mood normal.     LABORATORY DATA:  I have reviewed the data as listed    Latest Ref Rng & Units 08/22/2023    4:14 PM 02/03/2023    3:47 PM 08/02/2022    3:46 PM  CBC  WBC 4.0 - 10.5 K/uL 4.0  3.1  3.4   Hemoglobin 12.0 - 15.0 g/dL 56.2  13.0  86.5   Hematocrit 36.0 - 46.0 % 39.5  40.8  38.4   Platelets 150 - 400 K/uL 180  178  167       Latest Ref Rng & Units 08/22/2023    4:14 PM 02/03/2023    3:47 PM 08/02/2022    3:46 PM  CMP  Glucose 65 - 99 mg/dL  73  83   BUN 7 - 25 mg/dL  15  14   Creatinine 7.84 - 1.03 mg/dL  6.96  2.95   Sodium 284 - 146 mmol/L  140  139   Potassium 3.5 - 5.3 mmol/L  4.0  4.2   Chloride 98 - 110 mmol/L  103  103   CO2 20 -  32 mmol/L  30  29   Calcium  8.6 - 10.4 mg/dL  9.3  9.3   Total Protein 6.5 - 8.1 g/dL 7.7  7.2  7.1   Total Bilirubin 0.0 - 1.2 mg/dL 1.5  1.3  1.0   Alkaline Phos  38 - 126 U/L 89     AST 15 - 41 U/L 22  20  18    ALT 0 - 44 U/L 21  17  15        RADIOGRAPHIC STUDIES: I have personally reviewed the radiological images as listed and agreed with the findings in the report. MM 3D SCREENING MAMMOGRAM BILATERAL BREAST Result Date: 08/02/2023 CLINICAL DATA:  Screening. EXAM: DIGITAL SCREENING BILATERAL MAMMOGRAM WITH TOMOSYNTHESIS AND CAD TECHNIQUE: Bilateral screening digital craniocaudal and mediolateral oblique mammograms were obtained. Bilateral screening digital breast tomosynthesis was performed. The images were evaluated with computer-aided detection. COMPARISON:  Previous exam(s). ACR Breast Density Category c: The breasts are heterogeneously dense, which may obscure small masses. FINDINGS: There are no findings suspicious for malignancy. IMPRESSION: No mammographic evidence of malignancy. A result letter of this screening mammogram will be mailed directly to the patient. RECOMMENDATION: Screening mammogram in one year. (Code:SM-B-01Y) BI-RADS CATEGORY  1: Negative. Electronically Signed   By: Sande Cromer M.D.   On: 08/02/2023 08:34

## 2023-09-20 NOTE — Assessment & Plan Note (Signed)
 Primarily indirect hyperbilirubinemia.  Chronic condition for her. Monitor.  No need for intervention

## 2023-09-20 NOTE — Assessment & Plan Note (Signed)
 Chronic lymphopenia. Workup results were reviewed with patient. CBC showed no lymphocytopenia.  Adequate B12, folate levels.  No M protein on protein electrophoresis.  Normal light chain ratio.  Negative flow cytometry.  Normal LDH.  Negative hepatitis panel, ESR, CRP.  Negative EBV and CMV. Positive ANA, titer 1:1280 Underlying autoimmune disease may contribute to the chronic lymphocytopenia.

## 2024-01-31 ENCOUNTER — Other Ambulatory Visit (HOSPITAL_COMMUNITY): Payer: Self-pay

## 2024-02-06 NOTE — Patient Instructions (Signed)
 Preventive Care 55-55 Years Old, Female  Preventive care refers to lifestyle choices and visits with your health care provider that can promote health and wellness. Preventive care visits are also called wellness exams.  What can I expect for my preventive care visit?  Counseling  Your health care provider may ask you questions about your:  Medical history, including:  Past medical problems.  Family medical history.  Pregnancy history.  Current health, including:  Menstrual cycle.  Method of birth control.  Emotional well-being.  Home life and relationship well-being.  Sexual activity and sexual health.  Lifestyle, including:  Alcohol, nicotine or tobacco, and drug use.  Access to firearms.  Diet, exercise, and sleep habits.  Work and work Astronomer.  Sunscreen use.  Safety issues such as seatbelt and bike helmet use.  Physical exam  Your health care provider will check your:  Height and weight. These may be used to calculate your BMI (body mass index). BMI is a measurement that tells if you are at a healthy weight.  Waist circumference. This measures the distance around your waistline. This measurement also tells if you are at a healthy weight and may help predict your risk of certain diseases, such as type 2 diabetes and high blood pressure.  Heart rate and blood pressure.  Body temperature.  Skin for abnormal spots.  What immunizations do I need?    Vaccines are usually given at various ages, according to a schedule. Your health care provider will recommend vaccines for you based on your age, medical history, and lifestyle or other factors, such as travel or where you work.  What tests do I need?  Screening  Your health care provider may recommend screening tests for certain conditions. This may include:  Lipid and cholesterol levels.  Diabetes screening. This is done by checking your blood sugar (glucose) after you have not eaten for a while (fasting).  Pelvic exam and Pap test.  Hepatitis B test.  Hepatitis C  test.  HIV (human immunodeficiency virus) test.  STI (sexually transmitted infection) testing, if you are at risk.  Lung cancer screening.  Colorectal cancer screening.  Mammogram. Talk with your health care provider about when you should start having regular mammograms. This may depend on whether you have a family history of breast cancer.  BRCA-related cancer screening. This may be done if you have a family history of breast, ovarian, tubal, or peritoneal cancers.  Bone density scan. This is done to screen for osteoporosis.  Talk with your health care provider about your test results, treatment options, and if necessary, the need for more tests.  Follow these instructions at home:  Eating and drinking    Eat a diet that includes fresh fruits and vegetables, whole grains, lean protein, and low-fat dairy products.  Take vitamin and mineral supplements as recommended by your health care provider.  Do not drink alcohol if:  Your health care provider tells you not to drink.  You are pregnant, may be pregnant, or are planning to become pregnant.  If you drink alcohol:  Limit how much you have to 0-1 drink a day.  Know how much alcohol is in your drink. In the [place] one drink equals one 12 oz bottle of beer (355 mL), one 5 oz glass of wine (148 mL), or one 1 oz glass of hard liquor (44 mL).  Lifestyle  Brush your teeth every morning and night with fluoride toothpaste. Floss one time each day.  Exercise for at least  30 minutes 5 or more days each week.  Do not use any products that contain nicotine or tobacco. These products include cigarettes, chewing tobacco, and vaping devices, such as e-cigarettes. If you need help quitting, ask your health care provider.  Do not use drugs.  If you are sexually active, practice safe sex. Use a condom or other form of protection to prevent STIs.  If you do not wish to become pregnant, use a form of birth control. If you plan to become pregnant, see your health care provider for a  prepregnancy visit.  Take aspirin only as told by your health care provider. Make sure that you understand how much to take and what form to take. Work with your health care provider to find out whether it is safe and beneficial for you to take aspirin daily.  Find healthy ways to manage stress, such as:  Meditation, yoga, or listening to music.  Journaling.  Talking to a trusted person.  Spending time with friends and family.  Minimize exposure to UV radiation to reduce your risk of skin cancer.  Safety  Always wear your seat belt while driving or riding in a vehicle.  Do not drive:  If you have been drinking alcohol. Do not ride with someone who has been drinking.  When you are tired or distracted.  While texting.  If you have been using any mind-altering substances or drugs.  Wear a helmet and other protective equipment during sports activities.  If you have firearms in your house, make sure you follow all gun safety procedures.  Seek help if you have been physically or sexually abused.  What's next?  Visit your health care provider once a year for an annual wellness visit.  Ask your health care provider how often you should have your eyes and teeth checked.  Stay up to date on all vaccines.  This information is not intended to replace advice given to you by your health care provider. Make sure you discuss any questions you have with your health care provider.  Document Revised: 09/23/2020 Document Reviewed: 09/23/2020  Elsevier Patient Education  2024 [employer].

## 2024-02-07 ENCOUNTER — Ambulatory Visit (INDEPENDENT_AMBULATORY_CARE_PROVIDER_SITE_OTHER): Payer: Self-pay | Admitting: Family Medicine

## 2024-02-07 ENCOUNTER — Other Ambulatory Visit (HOSPITAL_COMMUNITY)
Admission: RE | Admit: 2024-02-07 | Discharge: 2024-02-07 | Disposition: A | Discharge status: Home / Self Care | Source: Ambulatory Visit | Attending: Family Medicine | Admitting: Family Medicine

## 2024-02-07 ENCOUNTER — Encounter: Payer: Self-pay | Admitting: Family Medicine

## 2024-02-07 VITALS — BP 132/84 | HR 75 | Resp 16 | Ht 69.0 in | Wt 161.0 lb

## 2024-02-07 DIAGNOSIS — Z0000 Encounter for general adult medical examination without abnormal findings: Secondary | ICD-10-CM | POA: Insufficient documentation

## 2024-02-07 DIAGNOSIS — Z124 Encounter for screening for malignant neoplasm of cervix: Secondary | ICD-10-CM | POA: Insufficient documentation

## 2024-02-07 DIAGNOSIS — B977 Papillomavirus as the cause of diseases classified elsewhere: Secondary | ICD-10-CM

## 2024-02-07 DIAGNOSIS — Z113 Encounter for screening for infections with a predominantly sexual mode of transmission: Secondary | ICD-10-CM

## 2024-02-07 DIAGNOSIS — N72 Inflammatory disease of cervix uteri: Secondary | ICD-10-CM | POA: Diagnosis not present

## 2024-02-07 DIAGNOSIS — E7800 Pure hypercholesterolemia, unspecified: Secondary | ICD-10-CM

## 2024-02-07 DIAGNOSIS — E559 Vitamin D deficiency, unspecified: Secondary | ICD-10-CM | POA: Diagnosis not present

## 2024-02-07 DIAGNOSIS — Z13820 Encounter for screening for osteoporosis: Secondary | ICD-10-CM

## 2024-02-07 DIAGNOSIS — R7303 Prediabetes: Secondary | ICD-10-CM | POA: Diagnosis not present

## 2024-02-07 DIAGNOSIS — Z1231 Encounter for screening mammogram for malignant neoplasm of breast: Secondary | ICD-10-CM

## 2024-02-07 DIAGNOSIS — E2839 Other primary ovarian failure: Secondary | ICD-10-CM

## 2024-02-07 NOTE — Progress Notes (Signed)
 Name: Tammy Evans   MRN: [id number]    DOB: 05/30/1970   Date:02/07/2024       Progress Note  Subjective  Chief Complaint  Chief Complaint  Patient presents with  . Annual Exam    HPI  Patient presents for annual CPE.  Diet: eating balanced diet  Exercise: walking more often  Last Eye Exam: completed Last Dental Exam: completed  Flowsheet Row Office Visit from 02/07/2024 in [facility]  AUDIT-C Score 1   Depression: Phq 9 is  negative    02/07/2024    3:18 PM 08/22/2023    4:11 PM 08/16/2023    8:49 AM 08/16/2023    8:41 AM 03/06/2023    3:26 PM  Depression screen PHQ 2/9  Decreased Interest 0 0 0 0 0  Down, Depressed, Hopeless 0 0 0 0 0  PHQ - 2 Score 0 0 0 0 0  Altered sleeping   0  0  Tired, decreased energy   0  0  Change in appetite   0  0  Feeling bad or failure about yourself    0  0  Trouble concentrating   0  0  Moving slowly or fidgety/restless   0  0  Suicidal thoughts   0  0  PHQ-9 Score   0  0  Difficult doing work/chores   Not difficult at all     Hypertension: BP Readings from Last 3 Encounters:  02/07/24 132/84  09/20/23 (!) 142/88  08/22/23 116/85   Obesity: Wt Readings from Last 3 Encounters:  02/07/24 161 lb (73 kg)  09/20/23 164 lb 6.4 oz (74.6 kg)  08/22/23 160 lb 1.6 oz (72.6 kg)   BMI Readings from Last 3 Encounters:  02/07/24 23.78 kg/m  09/20/23 24.28 kg/m  08/22/23 23.64 kg/m     Vaccines: reviewed with the patient.   Hep C Screening: completed STD testing and prevention (HIV/chl/gon/syphilis): today  Intimate partner violence: negative screen  Sexual History : one partner  Menstrual History/LMP/Abnormal Bleeding: post menpausal  Discussed importance of follow up if any post-menopausal bleeding: yes  Incontinence Symptoms: negative for symptoms   Breast cancer:  - Last Mammogram: up to date  - BRCA gene screening: N/A  Osteoporosis Prevention : Discussed high calcium  and vitamin D   supplementation, weight bearing exercises Bone density :yes   Cervical cancer screening: performing today  Skin cancer: Discussed monitoring for atypical lesions  Colorectal cancer: repeat in 2030   Lung cancer:  Low Dose CT Chest recommended if Age 71-80 years, 20 pack-year currently smoking OR have quit w/in 15years. Patient does not qualify for screen   ECG: next visit   Advanced Care Planning: A voluntary discussion about advance care planning including the explanation and discussion of advance directives.  Discussed health care proxy and Living will, and the patient was able to identify a health care proxy as daughter .  Patient does not have a living will and power of attorney of health care   Patient Active Problem List   Diagnosis Date Noted  . Positive ANA (antinuclear antibody) 09/20/2023  . Lymphocytopenia 08/22/2023  . Hyperbilirubinemia 08/22/2023  . B12 deficiency 08/16/2023  . Tension headache 08/16/2023  . Hypercholesterolemia 08/16/2023  . Menopausal vasomotor syndrome 12/08/2021  . Vitamin D  deficiency 12/08/2021  . Cervical polyp 01/29/2021  . Knee pain, left 12/06/2019  . Pre-diabetes 01/01/2018  . Major depression in remission 12/27/2017  . Other neutropenia 08/08/2016  . Genital herpes  08/08/2016  . GAD (generalized anxiety disorder) 11/10/2015  . Migraine without aura and responsive to treatment 01/13/2015  . Other insomnia 01/13/2015    Past Surgical History:  Procedure Laterality Date  . CESAREAN SECTION  04/11/88  . COLONOSCOPY WITH PROPOFOL  N/A 02/28/2020   Procedure: COLONOSCOPY WITH PROPOFOL ;  Surgeon: Jinny Carmine, MD;  Location: [facility];  Service: Endoscopy;  Laterality: N/A;  priority 4  . culposcopy    . TUBAL LIGATION      Family History  Problem Relation Age of Onset  . Hypertension Mother   . Diabetes Mother   . Hyperlipidemia Mother   . Diabetes Father   . Heart disease Father   . Hypertension Father   . Drug abuse  Sister   . Stroke Sister 19  . Heart murmur Brother   . Sickle cell trait Daughter   . Stroke Paternal Grandmother   . Breast cancer Neg Hx     Social History   Socioeconomic History  . Marital status: Single    Spouse name: Not on file  . Number of children: 2  . Years of education: Not on file  . Highest education level: Not on file  Occupational History  . Occupation: firefighter  Tobacco Use  . Smoking status: Never  . Smokeless tobacco: Never  Vaping Use  . Vaping status: Never Used  Substance and Sexual Activity  . Alcohol use: Yes    Alcohol/week: 0.0 standard drinks of alcohol    Comment: occasionally (1x/month)  . Drug use: No  . Sexual activity: Yes    Birth control/protection: Condom    Comment: Tubal Ligation   Other Topics Concern  . 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   She is living alone now.  She has grown children from previous relationship   Social Drivers of Corporate Investment Banker Strain: Low Risk  (02/07/2024)   Overall Financial Resource Strain (CARDIA)   . Difficulty of Paying Living Expenses: Not hard at all  Food Insecurity: No Food Insecurity (08/22/2023)   Hunger Vital Sign   . Worried About Programme Researcher, Broadcasting/film/video in the Last Year: Never true   . Ran Out of Food in the Last Year: Never true  Transportation Needs: No Transportation Needs (08/22/2023)   PRAPARE - Transportation   . Lack of Transportation (Medical): No   . Lack of Transportation (Non-Medical): No  Physical Activity: Insufficiently Active (02/07/2024)   Exercise Vital Sign   . Days of Exercise per Week: 4 days   . Minutes of Exercise per Session: 30 min  Stress: No Stress Concern Present (02/07/2024)   [employer] of Occupational Health - Occupational Stress Questionnaire   . Feeling of Stress: Not at all  Social Connections: Moderately Integrated (02/07/2024)   Social Connection and Isolation Panel   .  Frequency of Communication with Friends and Family: More than three times a week   . Frequency of Social Gatherings with Friends and Family: Once a week   . Attends Religious Services: More than 4 times per year   . Active Member of Clubs or Organizations: Yes   . Attends Banker Meetings: More than 4 times per year   . Marital Status: Never married  Intimate Partner Violence: Not At Risk (08/22/2023)   Humiliation, Afraid, Rape, and Kick questionnaire   . Fear of Current or Ex-Partner: No   . Emotionally Abused: No   .  Physically Abused: No   . Sexually Abused: No     Current Outpatient Medications:  .  Cholecalciferol (VITAMIN D3) 50 MCG (2000 UT) capsule, Take 1 capsule (2,000 Units total) by mouth daily., Disp: 100 capsule, Rfl: 1 .  rosuvastatin  (CRESTOR ) 10 MG tablet, Take 1 tablet (10 mg total) by mouth daily., Disp: 90 tablet, Rfl: 1 .  SUMAtriptan  (IMITREX ) 100 MG tablet, Take 1 tablet (100 mg total) by mouth every 2 (two) hours as needed for migraine., Disp: 10 tablet, Rfl: 2 .  traZODone  (DESYREL ) 50 MG tablet, Take 1 tablet (50 mg total) by mouth at bedtime as needed for sleep., Disp: 90 tablet, Rfl: 1 .  valACYclovir  (VALTREX ) 500 MG tablet, TAKE 1 TABLET (500 MG TOTAL) BY MOUTH DAILY. TAKE 1 TABLET (500 MG TOTAL) BY MOUTH DAILY. TAKE 3 TIMES A DAY FOR OUTBREAKS (Patient not taking: Reported on 09/20/2023), Disp: 110 tablet, Rfl: 1  Allergies  Allergen Reactions  . Macrobid [Nitrofurantoin]     headache     ROS  Constitutional: Negative for fever or weight change.  Respiratory: Negative for cough and shortness of breath.   Cardiovascular: Negative for chest pain or palpitations.  Gastrointestinal: Negative for abdominal pain, no bowel changes.  Musculoskeletal: Negative for gait problem or joint swelling.  Skin: Negative for rash.  Neurological: Negative for dizziness or headache.  No other specific complaints in a complete review of systems (except as  listed in HPI above).   Objective  Vitals:   02/07/24 1519  BP: 132/84  Pulse: 75  Resp: 16  SpO2: 99%  Weight: 161 lb (73 kg)  Height: 5' 9 (1.753 m)    Body mass index is 23.78 kg/m.  Physical Exam  Constitutional: Patient appears well-developed and well-nourished. No distress.  HENT: Head: Normocephalic and atraumatic. Ears: B TMs ok, no erythema or effusion; Nose: Nose normal. Mouth/Throat: Oropharynx is clear and moist. No oropharyngeal exudate.  Eyes: Conjunctivae and EOM are normal. Pupils are equal, round, and reactive to light. No scleral icterus.  Neck: Normal range of motion. Neck supple. No JVD present. No thyromegaly present.  Cardiovascular: Normal rate, regular rhythm and normal heart sounds.  No murmur heard. No BLE edema. Pulmonary/Chest: Effort normal and breath sounds normal. No respiratory distress. Abdominal: Soft. Bowel sounds are normal, no distension. There is no tenderness. no masses Breast: no lumps or masses, no nipple discharge or rashes FEMALE GENITALIA:  External genitalia normal External urethra normal Vaginal vault normal without discharge or lesions Cervix normal without discharge or lesions Bimanual exam normal without masses RECTAL: not done  Musculoskeletal: Normal range of motion, no joint effusions. No gross deformities Neurological: he is alert and oriented to person, place, and time. No cranial nerve deficit. Coordination, balance, strength, speech and gait are normal.  Skin: Skin is warm and dry. No rash noted. No erythema.  Psychiatric: Patient has a normal mood and affect. behavior is normal. Judgment and thought content normal.     Assessment & Plan  1. Well adult exam (Primary)  - CBC with Differential/Platelet - Comprehensive metabolic panel with GFR - Hemoglobin A1c - Cytology - PAP  2. Cervical cancer screening  - Cytology - PAP  3. High risk human papilloma virus (HPV) infection of cervix  - Cytology - PAP  4.  Pre-diabetes  - Hemoglobin A1c  5. Hypercholesterolemia  - Lipoprotein Fractionation, NMR with Lipid Panel (Triglycerides/HDL-C) Stopped medication a few weeks because it was causing upset stomach, advised to start  it again   6. Vitamin D  deficiency  - VITAMIN D  25 Hydroxy (Vit-D Deficiency, Fractures)  7. Screening examination for STI  - HIV Antibody (routine testing w rflx) - RPR   -USPSTF grade A and B recommendations reviewed with patient; age-appropriate recommendations, preventive care, screening tests, etc discussed and encouraged; healthy living encouraged; see AVS for patient education given to patient -Discussed importance of 150 minutes of physical activity weekly, eat two servings of fish weekly, eat one serving of tree nuts ( cashews, pistachios, pecans, almonds.SABRA) every other day, eat 6 servings of fruit/vegetables daily and drink plenty of water  and avoid sweet beverages.   -Reviewed Health Maintenance: Yes.

## 2024-02-09 LAB — CYTOLOGY - PAP
Chlamydia: NEGATIVE
Comment: NEGATIVE
Comment: NEGATIVE
Comment: NORMAL
Diagnosis: NEGATIVE
High risk HPV: POSITIVE — AB
Neisseria Gonorrhea: NEGATIVE

## 2024-02-11 LAB — CBC WITH DIFFERENTIAL/PLATELET
Absolute Lymphocytes: 912 {cells}/uL (ref 850–3900)
Absolute Monocytes: 387 {cells}/uL (ref 200–950)
Basophils Absolute: 9 {cells}/uL (ref 0–200)
Basophils Relative: 0.2 %
Eosinophils Absolute: 77 {cells}/uL (ref 15–500)
Eosinophils Relative: 1.8 %
HCT: 39.3 % (ref 35.0–45.0)
Hemoglobin: 13 g/dL (ref 11.7–15.5)
MCH: 29.8 pg (ref 27.0–33.0)
MCHC: 33.1 g/dL (ref 32.0–36.0)
MCV: 90.1 fL (ref 80.0–100.0)
MPV: 13.1 fL — ABNORMAL HIGH (ref 7.5–12.5)
Monocytes Relative: 9 %
Neutro Abs: 2915 {cells}/uL (ref 1500–7800)
Neutrophils Relative %: 67.8 %
Platelets: 162 Thousand/uL (ref 140–400)
RBC: 4.36 Million/uL (ref 3.80–5.10)
RDW: 13.5 % (ref 11.0–15.0)
Total Lymphocyte: 21.2 %
WBC: 4.3 Thousand/uL (ref 3.8–10.8)

## 2024-02-11 LAB — COMPREHENSIVE METABOLIC PANEL WITH GFR
AG Ratio: 2 (calc) (ref 1.0–2.5)
ALT: 15 U/L (ref 6–29)
AST: 19 U/L (ref 10–35)
Albumin: 4.7 g/dL (ref 3.6–5.1)
Alkaline phosphatase (APISO): 87 U/L (ref 37–153)
BUN: 14 mg/dL (ref 7–25)
CO2: 28 mmol/L (ref 20–32)
Calcium: 9.4 mg/dL (ref 8.6–10.4)
Chloride: 103 mmol/L (ref 98–110)
Creat: 1.02 mg/dL (ref 0.50–1.03)
Globulin: 2.3 g/dL (ref 1.9–3.7)
Glucose, Bld: 84 mg/dL (ref 65–99)
Potassium: 4 mmol/L (ref 3.5–5.3)
Sodium: 139 mmol/L (ref 135–146)
Total Bilirubin: 1.1 mg/dL (ref 0.2–1.2)
Total Protein: 7 g/dL (ref 6.1–8.1)
eGFR: 65 mL/min/1.73m2 (ref >=60)

## 2024-02-11 LAB — RPR: RPR Ser Ql: NONREACTIVE

## 2024-02-11 LAB — LIPOPROTEIN FRACTIONATION, NMR W/ LIPID PNL
CHOL/HDL C: 2.8 calc (ref <5.0)
CHOLESTEROL, TOTAL: 209 mg/dL — ABNORMAL HIGH (ref <200)
HDL CHOLESTEROL: 75 mg/dL (ref >49)
HDL P: 44.7 umol/L (ref >32.8)
HDL Size: 8.6 nm — ABNORMAL LOW (ref >9.0)
LARGE HDL P: 5.4 umol/L — ABNORMAL LOW (ref >7.2)
LARGE VLDL P: <1.5 nmol/L (ref <3.7)
LDL CHOLESTEROL: 119 mg/dL — ABNORMAL HIGH (ref <100)
LDL P: 1420 nmol/L — ABNORMAL HIGH (ref <935)
LDL SIZE: 21.1 nm (ref >20.5)
NON HDL CHOLESTEROL: 135 mg/dL — ABNORMAL HIGH (ref <130)
SMALL LDL P: 480 nmol/L — ABNORMAL HIGH (ref <467)
TG/HDL C: 0.9 calc (ref <2.0)
TRIGLYCERIDES: 64 mg/dL (ref <150)
VLDL Size: 45.3 nm (ref <47.1)

## 2024-02-11 LAB — HEMOGLOBIN A1C
Hgb A1c MFr Bld: 5.7 % — ABNORMAL HIGH (ref <5.7)
Mean Plasma Glucose: 117 mg/dL
eAG (mmol/L): 6.5 mmol/L

## 2024-02-11 LAB — VITAMIN D 25 HYDROXY (VIT D DEFICIENCY, FRACTURES): Vit D, 25-Hydroxy: 29 ng/mL — ABNORMAL LOW (ref 30–100)

## 2024-02-11 LAB — HIV ANTIBODY (ROUTINE TESTING W REFLEX)
HIV 1&2 Ab, 4th Generation: NONREACTIVE
HIV FINAL INTERPRETATION: NEGATIVE

## 2024-02-14 ENCOUNTER — Ambulatory Visit: Payer: Self-pay | Admitting: Family Medicine

## 2024-02-18 ENCOUNTER — Other Ambulatory Visit: Payer: Self-pay | Admitting: Family Medicine

## 2024-02-19 ENCOUNTER — Ambulatory Visit: Admitting: Family Medicine

## 2024-02-19 ENCOUNTER — Encounter: Payer: Self-pay | Admitting: Family Medicine

## 2024-02-19 VITALS — BP 124/78 | HR 63 | Resp 16 | Ht 69.0 in | Wt 160.6 lb

## 2024-02-19 DIAGNOSIS — E538 Deficiency of other specified B group vitamins: Secondary | ICD-10-CM

## 2024-02-19 DIAGNOSIS — R1013 Epigastric pain: Secondary | ICD-10-CM

## 2024-02-19 DIAGNOSIS — G4709 Other insomnia: Secondary | ICD-10-CM

## 2024-02-19 DIAGNOSIS — D708 Other neutropenia: Secondary | ICD-10-CM

## 2024-02-19 DIAGNOSIS — G43009 Migraine without aura, not intractable, without status migrainosus: Secondary | ICD-10-CM | POA: Diagnosis not present

## 2024-02-19 DIAGNOSIS — R87810 Cervical high risk human papillomavirus (HPV) DNA test positive: Secondary | ICD-10-CM | POA: Insufficient documentation

## 2024-02-19 DIAGNOSIS — E7800 Pure hypercholesterolemia, unspecified: Secondary | ICD-10-CM | POA: Diagnosis not present

## 2024-02-19 DIAGNOSIS — R7303 Prediabetes: Secondary | ICD-10-CM

## 2024-02-19 DIAGNOSIS — E559 Vitamin D deficiency, unspecified: Secondary | ICD-10-CM

## 2024-02-19 LAB — H. PYLORI BREATH TEST: H. pylori Breath Test: NOT DETECTED

## 2024-02-19 MED ORDER — OMEPRAZOLE 40 MG PO CPDR: 40.0000 mg | DELAYED_RELEASE_CAPSULE | Freq: Every day | ORAL | 0 refills | Status: AC

## 2024-02-19 MED ORDER — ROSUVASTATIN CALCIUM 20 MG PO TABS: 20.0000 mg | ORAL_TABLET | Freq: Every day | ORAL | 0 refills | Status: AC

## 2024-02-19 NOTE — Progress Notes (Signed)
 Name: Tammy Evans   MRN: [id number]    DOB: [birth year]   Date:02/19/2024       Progress Note  Subjective  Chief Complaint  Chief Complaint  Patient presents with   Medical Management of Chronic Issues   Discussed the use of AI scribe software for clinical note transcription with the patient, who gave verbal consent to proceed.  History of Present Illness Tammy Evans is a 53 year old female who presents with stomach pain and dyspepsia.  She has been experiencing intermittent stomach pain for about three months. The pain sometimes occurs after eating, but she also experiences it upon waking, even when she has not eaten since the previous day. She describes the sensation as feeling like 'something sitting in my stomach, like acid,' accompanied by a 'bitter' or 'sour' taste in her mouth.  She initially suspected her cholesterol medication might be causing the symptoms and stopped taking it for two weeks, but the symptoms persisted. She has tried Pepcid for relief but does not take it regularly. She experiences nausea and queasiness, which have been severe enough to cause her to leave work. She denies diarrhea, blood in stools, or significant changes in bowel habits.  Her current medications include Humetrax as needed for migraines and trazodone  for sleep, which she takes infrequently. She has a history of prediabetes and high cholesterol, for which she had been taking rosuvastatin  before stopping it due to her stomach issues.  She also reports having migraines, which occur about twice a month, typically triggered by stress. She uses Imitrex  as needed for migraine relief, which she finds effective.  She has a history of leukopenia and has seen a specialist for this condition. Her ANA test was positive with a speckle pattern, and she is scheduled to see a rheumatologist for further evaluation. She reports joint pain, particularly in her hands, which she describes as 'killing  me.'  She has a history of high-risk HPV but her recent Pap smear was normal.    Patient Active Problem List   Diagnosis Date Noted   Positive ANA (antinuclear antibody) 09/20/2023   Lymphocytopenia 08/22/2023   Hyperbilirubinemia 08/22/2023   B12 deficiency 08/16/2023   Tension headache 08/16/2023   Hypercholesterolemia 08/16/2023   Menopausal vasomotor syndrome 12/08/2021   Vitamin D  deficiency 12/08/2021   Cervical polyp 01/29/2021   Knee pain, left 12/06/2019   Pre-diabetes 01/01/2018   Major depression in remission 12/27/2017   Other neutropenia 08/08/2016   Genital herpes 08/08/2016   GAD (generalized anxiety disorder) 11/10/2015   Migraine without aura and responsive to treatment 01/13/2015   Other insomnia 01/13/2015    Past Surgical History:  Procedure Laterality Date   CESAREAN SECTION  04/11/88   COLONOSCOPY WITH PROPOFOL  N/A 02/28/2020   Procedure: COLONOSCOPY WITH PROPOFOL ;  Surgeon: Jinny Carmine, MD;  Location: [facility];  Service: Endoscopy;  Laterality: N/A;  priority 4   culposcopy     TUBAL LIGATION      Family History  Problem Relation Age of Onset   Hypertension Mother    Diabetes Mother    Hyperlipidemia Mother    Diabetes Father    Heart disease Father    Hypertension Father    Drug abuse Sister    Stroke Sister 50   Heart murmur Brother    Sickle cell trait Daughter    Stroke Paternal Grandmother    Breast cancer Neg Hx     Social History   Tobacco Use   Smoking  status: Never   Smokeless tobacco: Never  Substance Use Topics   Alcohol use: Yes    Alcohol/week: 0.0 standard drinks of alcohol    Comment: occasionally (1x/month)     Current Outpatient Medications:    Cholecalciferol (VITAMIN D3) 50 MCG (2000 UT) capsule, Take 1 capsule (2,000 Units total) by mouth daily., Disp: 100 capsule, Rfl: 1   SUMAtriptan  (IMITREX ) 100 MG tablet, Take 1 tablet (100 mg total) by mouth every 2 (two) hours as needed for migraine., Disp:  10 tablet, Rfl: 2   traZODone  (DESYREL ) 50 MG tablet, Take 1 tablet (50 mg total) by mouth at bedtime as needed for sleep., Disp: 90 tablet, Rfl: 1  Allergies  Allergen Reactions   Macrobid [Nitrofurantoin]     headache    I personally reviewed active problem list, medication list, allergies with the patient/caregiver today.   ROS  Ten systems reviewed and is negative except as mentioned in HPI    Objective Physical Exam CONSTITUTIONAL: Patient appears well-developed and well-nourished. No distress. HEENT: Head atraumatic, normocephalic, neck supple. CARDIOVASCULAR: Normal rate, regular rhythm and normal heart sounds. No murmur heard. No BLE edema. PULMONARY: Effort normal and breath sounds normal. No respiratory distress. ABDOMINAL: Tenderness generalized mostly on mid abdomen  MUSCULOSKELETAL: Normal gait. Without gross motor or sensory deficit. PSYCHIATRIC: Patient has a normal mood and affect. Behavior is normal. Judgment and thought content normal.  Vitals:   02/19/24 1542  BP: 124/78  Pulse: 63  Resp: 16  SpO2: 96%  Weight: 160 lb 9.6 oz (72.8 kg)  Height: 5' 9 (1.753 m)    Body mass index is 23.72 kg/m.  Recent Results (from the past 2160 hours)  Cytology - PAP     Status: Abnormal   Collection Time: 02/07/24  3:47 PM  Result Value Ref Range   High risk HPV Positive (A)    Neisseria Gonorrhea Negative    Chlamydia Negative    Adequacy      Satisfactory for evaluation; transformation zone component PRESENT.   Diagnosis      - Negative for intraepithelial lesion or malignancy (NILM)   Comment Normal Reference Range HPV - Negative    Comment Normal Reference Ranger Chlamydia - Negative    Comment      Normal Reference Range Neisseria Gonorrhea - Negative  CBC with Differential/Platelet     Status: Abnormal   Collection Time: 02/07/24  4:03 PM  Result Value Ref Range   WBC 4.3 3.8 - 10.8 Thousand/uL   RBC 4.36 3.80 - 5.10 Million/uL   Hemoglobin 13.0  11.7 - 15.5 g/dL   HCT 60.6 64.9 - 54.9 %   MCV 90.1 80.0 - 100.0 fL   MCH 29.8 27.0 - 33.0 pg   MCHC 33.1 32.0 - 36.0 g/dL    Comment: For adults, a slight decrease in the calculated MCHC value (in the range of 30 to 32 g/dL) is most likely not clinically significant; however, it should be interpreted with caution in correlation with other red cell parameters and the patient's clinical condition.    RDW 13.5 11.0 - 15.0 %   Platelets 162 140 - 400 Thousand/uL   MPV 13.1 (H) 7.5 - 12.5 fL   Neutro Abs 2,915 1,500 - 7,800 cells/uL   Absolute Lymphocytes 912 850 - 3,900 cells/uL   Absolute Monocytes 387 200 - 950 cells/uL   Eosinophils Absolute 77 15 - 500 cells/uL   Basophils Absolute 9 0 - 200 cells/uL   Neutrophils Relative %  67.8 %   Total Lymphocyte 21.2 %   Monocytes Relative 9.0 %   Eosinophils Relative 1.8 %   Basophils Relative 0.2 %  Comprehensive metabolic panel with GFR     Status: None   Collection Time: 02/07/24  4:03 PM  Result Value Ref Range   Glucose, Bld 84 65 - 99 mg/dL    Comment: .            Fasting reference interval .    BUN 14 7 - 25 mg/dL   Creat 8.97 9.49 - 8.96 mg/dL   eGFR 65 > OR = 60 fO/fpw/8.26f7   BUN/Creatinine Ratio SEE NOTE: 6 - 22 (calc)    Comment:    Not Reported: BUN and Creatinine are within    reference range. .    Sodium 139 135 - 146 mmol/L   Potassium 4.0 3.5 - 5.3 mmol/L   Chloride 103 98 - 110 mmol/L   CO2 28 20 - 32 mmol/L   Calcium  9.4 8.6 - 10.4 mg/dL   Total Protein 7.0 6.1 - 8.1 g/dL   Albumin 4.7 3.6 - 5.1 g/dL   Globulin 2.3 1.9 - 3.7 g/dL (calc)   AG Ratio 2.0 1.0 - 2.5 (calc)   Total Bilirubin 1.1 0.2 - 1.2 mg/dL   Alkaline phosphatase (APISO) 87 37 - 153 U/L   AST 19 10 - 35 U/L   ALT 15 6 - 29 U/L  Hemoglobin A1c     Status: Abnormal   Collection Time: 02/07/24  4:03 PM  Result Value Ref Range   Hgb A1c MFr Bld 5.7 (H) <5.7 %    Comment: For someone without known diabetes, a hemoglobin  A1c value  between 5.7% and 6.4% is consistent with prediabetes and should be confirmed with a  follow-up test. . For someone with known diabetes, a value <7% indicates that their diabetes is well controlled. A1c targets should be individualized based on duration of diabetes, age, comorbid conditions, and other considerations. . This assay result is consistent with an increased risk of diabetes. . Currently, no consensus exists regarding use of hemoglobin A1c for diagnosis of diabetes for children. .    Mean Plasma Glucose 117 mg/dL   eAG (mmol/L) 6.5 mmol/L  VITAMIN D  25 Hydroxy (Vit-D Deficiency, Fractures)     Status: Abnormal   Collection Time: 02/07/24  4:03 PM  Result Value Ref Range   Vit D, 25-Hydroxy 29 (L) 30 - 100 ng/mL    Comment: Vitamin D  Status         25-OH Vitamin D : . Deficiency:                    <20 ng/mL Insufficiency:             20 - 29 ng/mL Optimal:                 > or = 30 ng/mL . For 25-OH Vitamin D  testing on patients on  D2-supplementation and patients for whom quantitation  of D2 and D3 fractions is required, the [facility](TM) 25-OH VIT D, (D2,D3), LC/MS/MS is recommended: order  code 07111 (patients >43yrs). . See Note 1 . Note 1 . For additional information, please refer to  http://education.[facility].com/faq/FAQ199  (This link is being provided for informational/ educational purposes only.)   Lipoprotein Fractionation, NMR with Lipid Panel (Triglycerides/HDL-C)     Status: Abnormal   Collection Time: 02/07/24  4:03 PM  Result Value Ref Range  CHOLESTEROL, TOTAL 209 (H) <200 mg/dL   HDL CHOLESTEROL 75 >50 mg/dL   TRIGLYCERIDES 64 <849 mg/dL   LDL CHOLESTEROL 880 (H) <100 mg/dL (calc)    Comment: Desirable range <100 mg/dL for primary prevention; <70 mg/dL for patients with CHD or diabetic patients with >= 2 CHD risk factors.  LDL-C is now calculated using the Martin-Hopkins calculation, which is a validated novel method  providing better accuracy than the Friedewald equation in the estimation of LDL-C. Gladis APPLETHWAITE et al. SANDREA. 7986;689(80): 2061-2068 (http://education.[facility].com/faq/FAQ164)     CHOL/HDL C 2.8 <5.0 calc   NON HDL CHOLESTEROL 135 (H) <130 mg/dL (calc)    Comment: For patients with diabetes plus 1 major ASCVD risk factor, treating to a non-HDL-C goal of <100 mg/dL (LDL-C of <29 mg/dL) is considered a therapeutic option.     TG/HDL C 0.9 <2.0 calc   LDL P 1,420 (H) <935 nmol/L    Comment: This test was developed and its analytical performance characteristics have been determined by [facility]. It has not been cleared or approved by the U.S. Food and Drug Administration. This assay has been validated pursuant to the CLIA regulations and is used for clinical purposes.  Relative risk: Optimal <935; Moderate [phone]; High >1816 nmol/L.  Reference range is [phone] nmol/L.     SMALL LDL P 480 (H) <467 nmol/L    Comment: Relative risk: Optimal <467; Moderate [id number]; High >820 nmol/L.  Reference range is <1408 nmol/L.     LDL SIZE 21.1 >20.5 nm    Comment: Relative risk: Optimal >20.5; High <20.6 nm.  Reference range is 20.0-22.3 nm.     HDL P 44.7 >32.8 umol/L    Comment: Relative risk: Optimal >32.8; Moderate 29.2-32.8; High <29.2 umol/L.  Reference range is 21.1-43.4 umol/L.     LARGE HDL P 5.4 (L) >7.2 umol/L    Comment: Relative risk: Optimal >7.2; Moderate 5.3-7.2; High <5.3 umol/L.  Reference range is >3.5  umol/L.     HDL Size 8.6 (L) >9.0 nm    Comment: Relative risk: Optimal >9.0; Moderate 8.7-9.0; High <8.7 nm.  Reference range is 8.3-10.5 nm.     LARGE VLDL P <1.5 <3.7 nmol/L    Comment: Relative risk: Optimal <3.7; Moderate 3.7-6.1; High >6.1 nmol/L.  Reference range is <16.0 nmol/L.     VLDL Size 45.3 <47.1 nm    Comment: Relative risk: Optimal <47.1; Moderate 47.1-49.0; High >49.0 nm.   Reference range is 41.1-61.7 nm.    HIV Antibody (routine testing w rflx)     Status: None   Collection Time: 02/07/24  4:03 PM  Result Value Ref Range   HIV FINAL INTERPRETATION HIV NEGATIVE     Comment: HIV-1 antigen and HIV-1/HIV-2 antibodies were not detected. There is no laboratory evidence of HIV infection.    HIV 1&2 Ab, 4th Generation NON-REACTIVE NON-REACTIVE  RPR     Status: None   Collection Time: 02/07/24  4:03 PM  Result Value Ref Range   RPR Ser Ql NON-REACTIVE NON-REACTIVE    Comment: . No laboratory evidence of syphilis. If recent exposure is suspected, submit a new sample in 2-4 weeks. .       PHQ2/9:    02/19/2024    3:42 PM 02/07/2024    3:18 PM 08/22/2023    4:11 PM 08/16/2023    8:49 AM 08/16/2023    8:41 AM  Depression screen PHQ 2/9  Decreased Interest 0 0 0 0  0  Down, Depressed, Hopeless 0 0 0 0 0  PHQ - 2 Score 0 0 0 0 0  Altered sleeping    0   Tired, decreased energy    0   Change in appetite    0   Feeling bad or failure about yourself     0   Trouble concentrating    0   Moving slowly or fidgety/restless    0   Suicidal thoughts    0   PHQ-9 Score    0    Difficult doing work/chores    Not difficult at all      Data saved with a previous flowsheet row definition    phq 9 is negative  Fall Risk:    02/19/2024    3:42 PM 02/07/2024    3:18 PM 08/16/2023    8:41 AM 03/06/2023    3:26 PM 02/03/2023    3:06 PM  Fall Risk   Falls in the past year? 0 0 0 0 0  Number falls in past yr: 0 0 0  0  Injury with Fall? 0 0 0  0  Risk for fall due to : No Fall Risks No Fall Risks No Fall Risks No Fall Risks No Fall Risks  Follow up Falls evaluation completed Falls prevention discussed Falls prevention discussed;Education provided;Falls evaluation completed Falls prevention discussed Falls prevention discussed    Assessment & Plan Dyspepsia with epigastric pain and reflux symptoms Intermittent epigastric pain and reflux symptoms suggestive of  reflux, differential includes H. pylori infection. - Ordered H. pylori breath test. - Prescribed daily reflux medication 30 minutes before largest meal. - Provided 90-day supply of reflux medication. - Refer to GI specialist if H. pylori test is negative.  Pure hypercholesterolemia Cholesterol levels worsened post-rosuvastatin  discontinuation. Patient agreed to restart medication. - Restarted rosuvastatin . - Scheduled follow-up in three months to reassess cholesterol levels.  Prediabetes A1c indicates prediabetes. - Advised reducing carbohydrate intake. - Encouraged physical activity.  Migraine without aura Migraines occur twice monthly, stress-related, managed with Imitrex  effectively. - Continue Imitrex  as needed.  Vitamin D  deficiency (improving) Vitamin D  levels improved with supplementation. - Continue vitamin D  supplementation.  Cervical high risk HPV DNA test positive with normal Pap High risk HPV DNA positive, normal Pap smear. - Recheck HPV status annually. - Perform Pap smear next year.

## 2024-02-20 ENCOUNTER — Ambulatory Visit: Payer: Self-pay | Admitting: Family Medicine

## 2024-03-12 NOTE — Progress Notes (Addendum)
 "  Office Visit Note  Patient: Tammy Evans             Date of Birth: 1970-04-23           MRN: [id number]             PCP: Glenard Mire, MD Referring: Babara Call, MD Visit Date: 03/21/2024 Occupation: Data Unavailable  Subjective:  Pain in multiple joints, positive ANA  History of Present Illness: Tammy Evans is a 53 y.o. female seen for the evaluation of positive ANA and arthralgias.  According the patient her symptoms started with bilateral knee joint pain about 15 years ago.  She states she was seen by an orthopedic surgeon and was diagnosed with osteoarthritis.  She recalls having a cortisone injection to her right knee joint.  She continues to have knee joint discomfort.  For the last several years she has been also having discomfort in her shoulders and was diagnosed with shoulder joint bursitis in the past.  She continues to have discomfort in her shoulders.  For the last 10 years she has been having pain and discomfort in her both hands which is gradually getting worse.  She has noticed decreased grip strength and ongoing pain.  She also has some discomfort in her left ankle and her left heel.  She has not noticed any joint swelling.  She gives history of fatigue, arthralgias and myalgias.  There is no history of oral ulcers, nasal ulcers, sicca symptoms, malar rash, photosensitivity, Raynaud's, lymphadenopathy. There is questionable history of autoimmune disease in her maternal aunt.  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  She used to enjoy workout but recently has been only walking.  She is single, and has 2 children.  There is no history of preeclampsia or DVTs.  She drinks alcohol only over the weekends.  She has never been a smoker.    Activities of Daily Living:  Patient reports morning stiffness for 1 hour.   Patient Reports nocturnal pain.  Difficulty dressing/grooming: Denies Difficulty  climbing stairs: Denies Difficulty getting out of chair: Denies Difficulty using hands for taps, buttons, cutlery, and/or writing: Reports  Review of Systems  Constitutional:  Positive for fatigue.  HENT:  Negative for mouth sores and mouth dryness.   Eyes:  Negative for dryness.  Respiratory:  Negative for shortness of breath.   Cardiovascular:  Negative for chest pain and palpitations.  Gastrointestinal:  Negative for blood in stool, constipation and diarrhea.  Endocrine: Negative for increased urination.  Genitourinary:  Negative for involuntary urination.  Musculoskeletal:  Positive for joint pain, gait problem, joint pain, myalgias, muscle weakness, morning stiffness, muscle tenderness and myalgias. Negative for joint swelling.  Skin:  Negative for color change, rash, hair loss and sensitivity to sunlight.  Allergic/Immunologic: Negative for susceptible to infections.  Neurological:  Negative for dizziness and headaches.  Hematological:  Negative for swollen glands.  Psychiatric/Behavioral:  Negative for depressed mood and sleep disturbance. The patient is not nervous/anxious.     PMFS History:  Patient Active Problem List   Diagnosis Date Noted   Cervical high risk HPV (human papillomavirus) test positive 02/19/2024   Positive ANA (antinuclear antibody) 09/20/2023   Lymphocytopenia 08/22/2023   Hyperbilirubinemia 08/22/2023   B12 deficiency 08/16/2023   Tension headache 08/16/2023   Hypercholesterolemia 08/16/2023   Menopausal vasomotor syndrome 12/08/2021   Vitamin D  deficiency 12/08/2021   Cervical polyp 01/29/2021  Knee pain, left 12/06/2019   Pre-diabetes 01/01/2018   Major depression in remission 12/27/2017   Other neutropenia 08/08/2016   Genital herpes 08/08/2016   GAD (generalized anxiety disorder) 11/10/2015   Migraine without aura and responsive to treatment 01/13/2015   Other insomnia 01/13/2015    Past Medical History:  Diagnosis Date   Chronic insomnia     Headache, common migraine    4-5 x/yr   Menopausal state     Family History  Problem Relation Age of Onset   Hypertension Mother    Diabetes Mother    Hyperlipidemia Mother    Diabetes Father    Heart disease Father    Hypertension Father    Drug abuse Sister    Stroke Sister 50   Heart murmur Brother    Arthritis Paternal Aunt    Stroke Paternal Grandmother    Sickle cell trait Daughter    Healthy Son    Breast cancer Neg Hx    Past Surgical History:  Procedure Laterality Date   CESAREAN SECTION  04/11/88   COLONOSCOPY WITH PROPOFOL  N/A 02/28/2020   Procedure: COLONOSCOPY WITH PROPOFOL ;  Surgeon: Jinny Carmine, MD;  Location: [facility];  Service: Endoscopy;  Laterality: N/A;  priority 4   culposcopy     TUBAL LIGATION     Social History   Tobacco Use   Smoking status: Never    Passive exposure: Past   Smokeless tobacco: Never  Vaping Use   Vaping status: Never Used  Substance Use Topics   Alcohol use: Yes    Alcohol/week: 0.0 standard drinks of alcohol    Comment: occasionally (1x/month)   Drug use: No   Social History   Social History Narrative   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   She is living alone now.  She has grown children from previous relationship     Immunization History  Administered Date(s) Administered   Comptroller (J&J) SARS-COV-2 Vaccination 10/01/2019   Tdap 04/11/2009, 12/09/2019   Zoster Recombinant(Shingrix) 01/29/2021, 05/07/2021     Objective: Vital Signs: BP 131/88 (BP Location: Right Arm, Patient Position: Sitting, Cuff Size: Normal)   Pulse 61   Temp 97.7 F (36.5 C)   Resp 16   Ht 5' 4.5 (1.638 m)   Wt 160 lb 9.6 oz (72.8 kg)   LMP 05/13/2019 Comment: menopausal  BMI 27.14 kg/m    Physical Exam Vitals and nursing note reviewed.  Constitutional:      Appearance: She is well-developed.  HENT:     Head: Normocephalic and atraumatic.  Eyes:     Conjunctiva/sclera: Conjunctivae normal.   Cardiovascular:     Rate and Rhythm: Normal rate and regular rhythm.     Heart sounds: Normal heart sounds.  Pulmonary:     Effort: Pulmonary effort is normal.     Breath sounds: Normal breath sounds.  Abdominal:     General: Bowel sounds are normal.     Palpations: Abdomen is soft.  Musculoskeletal:     Cervical back: Normal range of motion.  Skin:    General: Skin is warm and dry.     Capillary Refill: Capillary refill takes less than 2 seconds.  Neurological:     Mental Status: She is alert and oriented to person, place, and time.  Psychiatric:        Behavior: Behavior normal.      Musculoskeletal Exam: Cervical, thoracic and lumbar spine were in good range of motion.  There was no SI  joint tenderness.  Shoulder joints, elbow joints, wrist joints, MCPs, PIPs and DIPs were in good range of motion with no synovitis.  She discomfort with range of motion of her shoulder joints especially with internal rotation.  Hip joints and knee joints were in good range of motion without any warmth swelling or effusion.  She had tenderness on palpation of her left heel.  There was no tenderness over ankles or MTPs.  No synovitis was noted.   CDAI Exam: CDAI Score: -- Patient Global: --; Provider Global: -- Swollen: --; Tender: -- Joint Exam 03/21/2024   No joint exam has been documented for this visit   There is currently no information documented on the homunculus. Go to the Rheumatology activity and complete the homunculus joint exam.  Investigation: No additional findings.  Imaging: No results found.  Recent Labs: Lab Results  Component Value Date   WBC 4.3 02/07/2024   HGB 13.0 02/07/2024   PLT 162 02/07/2024   NA 139 02/07/2024   K 4.0 02/07/2024   CL 103 02/07/2024   CO2 28 02/07/2024   GLUCOSE 84 02/07/2024   BUN 14 02/07/2024   CREATININE 1.02 02/07/2024   BILITOT 1.1 02/07/2024   ALKPHOS 89 08/22/2023   AST 19 02/07/2024   ALT 15 02/07/2024   PROT 7.0 02/07/2024    ALBUMIN 4.6 08/22/2023   CALCIUM  9.4 02/07/2024   GFRAA 76 12/09/2019   11/22/2023 AST ALT normal, hepatitis A, B and C nonreactive, IFE normal, CMV negative, EBV negative, CRP 0.5, sed rate 7, folate 15.4, B12 3000 171, ANA positive, LDH 184, February 07, 2024 hemoglobin A1c 5.7, vitamin D  29,  February 07, 2024 WBC 4.3, hemoglobin 13.0, platelets 162, CMP normal, hemoglobin A1c 5.7, RPR negative  Speciality Comments: No specialty comments available.  Procedures:  No procedures performed Allergies: Macrobid [nitrofurantoin]   Assessment / Plan:     Visit Diagnoses: Positive ANA (antinuclear antibody) -patient has been experiencing arthralgias for the last 10 to 15 years.  She was found to have positive ANA with no titer given.  She denies any history of focal ulcers, nasal ulcers, malar rash, photosensitivity, Raynaud's, lymphadenopathy or inflammatory arthritis.  I will obtain following labs to complete the workup.  Plan: ANA, RNP Antibody, Anti-Smith antibody, Sjogrens syndrome-A extractable nuclear antibody, Sjogrens syndrome-B extractable nuclear antibody, Anti-DNA antibody, double-stranded, C3 and C4, Anti-scleroderma antibody, Beta-2  glycoprotein antibodies, Cardiolipin antibodies, IgG, IgM, IgA, Protein / creatinine ratio, urine  Chronic pain of both shoulders -she has been experiencing pain and discomfort in her shoulders for many years.  She had discomfort with range of motion on the examination especially with internal rotation.  Patient states she was diagnosed with bursitis and tendinitis in the past.  Plan: XR Shoulder Left, XR Shoulder Right.  X-rays of bilateral shoulder joints were unremarkable.  Pain in both hands -she complains of progressive pain in her both hands over the last 10 years.  She notices decreased grip strength.  No synovitis was noted on the examination today.  Plan: Sedimentation rate, Rheumatoid factor, Cyclic citrul peptide antibody, IgG, XR Hand 2 View Right,  XR Hand 2 View Left.  X-rays of bilateral hands were unremarkable.  Chronic pain of both knees -she complains of pain and discomfort in the bilateral knee joints for the last 15 years.  She states she was diagnosed with osteoarthritis by an orthopedic surgeon in the past.  She also had a cortisone injection to her right knee joint in the past.  She has not noticed any joint swelling.  No warmth swelling or effusion was noted.  Plan: XR KNEE 3 VIEW RIGHT, XR KNEE 3 VIEW LEFT.  X-rays of bilateral knee joints were unremarkable except for prominence of the tibial tuberosity.  Pain in left foot -she has been experiencing pain and discomfort in her left heel and has difficulty walking barefoot.  No synovitis was noted.  Plan: Uric acid, XR Foot 2 Views Left.  X-rays of the left foot were unremarkable.  Vitamin D  deficiency-vitamin D  was low at 29 on February 07, 2024.  She has been taking vitamin D .  B12 deficiency-she has history of B12 deficiency.  Hypercholesterolemia-she is on Crestor  20 mg daily.  Other medical problems are listed as follows:  Pre-diabetes  Other insomnia  Genital herpes simplex, unspecified site  Migraine without aura and responsive to treatment  Anxiety and depression  Orders: Orders Placed This Encounter  Procedures   XR Shoulder Left   XR Shoulder Right   XR Hand 2 View Right   XR Hand 2 View Left   XR KNEE 3 VIEW RIGHT   XR KNEE 3 VIEW LEFT   XR Foot 2 Views Left   Sedimentation rate   Rheumatoid factor   Cyclic citrul peptide antibody, IgG   ANA   RNP Antibody   Anti-Smith antibody   Sjogrens syndrome-A extractable nuclear antibody   Sjogrens syndrome-B extractable nuclear antibody   Anti-DNA antibody, double-stranded   C3 and C4   Anti-scleroderma antibody   Beta-2  glycoprotein antibodies   Cardiolipin antibodies, IgG, IgM, IgA   Uric acid   Protein / creatinine ratio, urine   No orders of the defined types were placed in this  encounter.   Follow-Up Instructions: Return for Positive ANA, polyarthralgia.   Maya Nash, MD  Note - This record has been created using Animal nutritionist.  Chart creation errors have been sought, but may not always  have been located. Such creation errors do not reflect on  the standard of medical care. "

## 2024-03-21 ENCOUNTER — Ambulatory Visit

## 2024-03-21 ENCOUNTER — Encounter: Payer: Self-pay | Admitting: Rheumatology

## 2024-03-21 ENCOUNTER — Ambulatory Visit: Admitting: Family Medicine

## 2024-03-21 ENCOUNTER — Ambulatory Visit: Attending: Rheumatology | Admitting: Rheumatology

## 2024-03-21 VITALS — BP 131/88 | HR 61 | Temp 97.7°F | Resp 16 | Ht 64.5 in | Wt 160.6 lb

## 2024-03-21 DIAGNOSIS — M25561 Pain in right knee: Secondary | ICD-10-CM

## 2024-03-21 DIAGNOSIS — M79672 Pain in left foot: Secondary | ICD-10-CM

## 2024-03-21 DIAGNOSIS — G4709 Other insomnia: Secondary | ICD-10-CM

## 2024-03-21 DIAGNOSIS — A6000 Herpesviral infection of urogenital system, unspecified: Secondary | ICD-10-CM | POA: Diagnosis not present

## 2024-03-21 DIAGNOSIS — R7303 Prediabetes: Secondary | ICD-10-CM

## 2024-03-21 DIAGNOSIS — M79642 Pain in left hand: Secondary | ICD-10-CM

## 2024-03-21 DIAGNOSIS — G8929 Other chronic pain: Secondary | ICD-10-CM

## 2024-03-21 DIAGNOSIS — D72810 Lymphocytopenia: Secondary | ICD-10-CM

## 2024-03-21 DIAGNOSIS — M25511 Pain in right shoulder: Secondary | ICD-10-CM

## 2024-03-21 DIAGNOSIS — E7800 Pure hypercholesterolemia, unspecified: Secondary | ICD-10-CM

## 2024-03-21 DIAGNOSIS — M25562 Pain in left knee: Secondary | ICD-10-CM

## 2024-03-21 DIAGNOSIS — M25512 Pain in left shoulder: Secondary | ICD-10-CM

## 2024-03-21 DIAGNOSIS — E559 Vitamin D deficiency, unspecified: Secondary | ICD-10-CM

## 2024-03-21 DIAGNOSIS — M79641 Pain in right hand: Secondary | ICD-10-CM | POA: Diagnosis not present

## 2024-03-21 DIAGNOSIS — F419 Anxiety disorder, unspecified: Secondary | ICD-10-CM

## 2024-03-21 DIAGNOSIS — E538 Deficiency of other specified B group vitamins: Secondary | ICD-10-CM

## 2024-03-21 DIAGNOSIS — G43009 Migraine without aura, not intractable, without status migrainosus: Secondary | ICD-10-CM | POA: Diagnosis not present

## 2024-03-21 DIAGNOSIS — R7689 Other specified abnormal immunological findings in serum: Secondary | ICD-10-CM | POA: Diagnosis not present

## 2024-03-21 DIAGNOSIS — F32A Depression, unspecified: Secondary | ICD-10-CM

## 2024-03-21 NOTE — Patient Instructions (Signed)
 Exercises for Chronic Knee Pain Chronic knee pain is pain that lasts longer than 3 months. For most people with chronic knee pain, exercise and weight loss is an important part of treatment. Your health care provider may want you to focus on: Making the muscles that support your knee stronger. This can take pressure off your knee and reduce pain. Preventing knee stiffness. How far you can move your knee, keeping it there or making it farther. Losing weight (if this applies) to take pressure off your knee, lower your risk for injury, and make it easier for you to exercise. Your provider will help you make an exercise program that fits your needs and physical abilities. Below are simple, low-impact exercises you can do at home. Ask your provider or physical therapist how often you should do your exercise program and how many times to repeat each exercise. General safety tips  Get your provider's approval before doing any exercises. Start slowly and stop any time you feel pain. Do not exercise if your knee pain is flaring up. Warm up first. Stretching a cold muscle can cause an injury. Do 5-10 minutes of easy movement or light stretching before beginning your exercises. Do 5-10 minutes of low-impact activity (like walking or cycling) before starting strengthening exercises. Contact your provider any time you have pain during or after exercising. Exercise can cause discomfort but should not be painful. It is normal to be a little stiff or sore after exercising. Stretching and range-of-motion exercises Front thigh stretch  Stand up straight and support your body by holding on to a chair or resting one hand on a wall. With your legs straight and close together, bend one knee to lift your heel up toward your butt. Using one hand for support, grab your ankle with your free hand. Pull your foot up closer toward your butt to feel the stretch in front of your thigh. Hold the stretch for 30  seconds. Repeat __________ times. Complete this exercise __________ times a day. Back thigh stretch  Sit on the floor with your back straight and your legs out straight in front of you. Place the palms of your hands on the floor and slide them toward your feet as you bend at the hip. Try to touch your nose to your knees and feel the stretch in the back of your thighs. Hold for 30 seconds. Repeat __________ times. Complete this exercise __________ times a day. Calf stretch  Stand facing a wall. Place the palms of your hands flat against the wall, arms extended, and lean slightly against the wall. Get into a lunge position with one leg bent at the knee and the other leg stretched out straight behind you. Keep both feet facing the wall and increase the bend in your knee while keeping the heel of the other leg flat on the ground. You should feel the stretch in your calf. Hold for 30 seconds. Repeat __________ times. Complete this exercise __________ times a day. Strengthening exercises Straight leg lift  Lie on your back with one knee bent and the other leg out straight. Slowly lift the straight leg without bending the knee. Lift until your foot is about 12 inches (30 cm) off the floor. Hold for 3-5 seconds and slowly lower your leg. Repeat __________ times. Complete this exercise __________ times a day. Single leg dip  Stand between two chairs and put both hands on the backs of the chairs for support. Extend one leg out straight with your body  weight resting on the heel of the standing leg. Slowly bend your standing knee to dip your body to the level that is comfortable for you. Hold for 3-5 seconds. Repeat __________ times. Complete this exercise __________ times a day. Hamstring curls  Stand straight, knees close together, facing the back of a chair. Hold on to the back of a chair with both hands. Keep one leg straight. Bend the other knee while bringing the heel up toward the butt  until the knee is bent at a 90-degree angle (right angle). Hold for 3-5 seconds. Repeat __________ times. Complete this exercise __________ times a day. Wall squat  Stand straight with your back, hips, and head against a wall. Step forward one foot at a time with your back still against the wall. Your feet should be 2 feet (61 cm) from the wall at shoulder width. Keeping your back, hips, and head against the wall, slide down the wall to as close to a sitting position as you can get. Hold for 5-10 seconds, then slowly slide back up. Repeat __________ times. Complete this exercise __________ times a day. Step-ups  Stand in front of a sturdy platform or stool that is about 6 inches (15 cm) high. Slowly step up with your left / right foot, keeping your knee in line with your hip and foot. Do not let your knee bend so far that you cannot see your toes. Hold on to a chair for balance, but do not use it for support. Slowly unlock your knee and lower yourself to the starting position. Repeat __________ times. Complete this exercise __________ times a day. Contact a health care provider if: Your exercises cause pain. Your pain is worse after you exercise. Your pain prevents you from doing your exercises. This information is not intended to replace advice given to you by your health care provider. Make sure you discuss any questions you have with your health care provider. Document Revised: 04/12/2022 Document Reviewed: 04/12/2022 [employer] Patient Education  2024 [employer]Hand Exercises Hand exercises can be helpful for almost anyone. They can strengthen your hands and improve flexibility and movement. The exercises can also increase blood flow to the hands. These results can make your work and daily tasks easier for you. Hand exercises can be especially helpful for people who have joint pain from arthritis or nerve damage from using their hands over and over. These exercises can also help people  who injure a hand. Exercises Most of these hand exercises are gentle stretching and motion exercises. It is usually safe to do them often throughout the day. Warming up your hands before exercise may help reduce stiffness. You can do this with gentle massage or by placing your hands in warm water  for 10-15 minutes. It is normal to feel some stretching, pulling, tightness, or mild discomfort when you begin new exercises. In time, this will improve. Remember to always be careful and stop right away if you feel sudden, very bad pain or your pain gets worse. You want to get better and be safe. Ask your health care provider which exercises are safe for you. Do exercises exactly as told by your provider and adjust them as told. Do not begin these exercises until told by your provider. Knuckle bend or claw fist  Stand or sit with your arm, hand, and all five fingers pointed straight up. Make sure to keep your wrist straight. Gently bend your fingers down toward your palm until the tips of your fingers are  touching your palm. Keep your big knuckle straight and only bend the small knuckles in your fingers. Hold this position for 10 seconds. Straighten your fingers back to your starting position. Repeat this exercise 5-10 times with each hand. Full finger fist  Stand or sit with your arm, hand, and all five fingers pointed straight up. Make sure to keep your wrist straight. Gently bend your fingers into your palm until the tips of your fingers are touching the middle of your palm. Hold this position for 10 seconds. Extend your fingers back to your starting position, stretching every joint fully. Repeat this exercise 5-10 times with each hand. Straight fist  Stand or sit with your arm, hand, and all five fingers pointed straight up. Make sure to keep your wrist straight. Gently bend your fingers at the big knuckle, where your fingers meet your hand, and at the middle knuckle. Keep the knuckle at the  tips of your fingers straight and try to touch the bottom of your palm. Hold this position for 10 seconds. Extend your fingers back to your starting position, stretching every joint fully. Repeat this exercise 5-10 times with each hand. Tabletop  Stand or sit with your arm, hand, and all five fingers pointed straight up. Make sure to keep your wrist straight. Gently bend your fingers at the big knuckle, where your fingers meet your hand, as far down as you can. Keep the small knuckles in your fingers straight. Think of forming a tabletop with your fingers. Hold this position for 10 seconds. Extend your fingers back to your starting position, stretching every joint fully. Repeat this exercise 5-10 times with each hand. Finger spread  Place your hand flat on a table with your palm facing down. Make sure your wrist stays straight. Spread your fingers and thumb apart from each other as far as you can until you feel a gentle stretch. Hold this position for 10 seconds. Bring your fingers and thumb tight together again. Hold this position for 10 seconds. Repeat this exercise 5-10 times with each hand. Making circles  Stand or sit with your arm, hand, and all five fingers pointed straight up. Make sure to keep your wrist straight. Make a circle by touching the tip of your thumb to the tip of your index finger. Hold for 10 seconds. Then open your hand wide. Repeat this motion with your thumb and each of your fingers. Repeat this exercise 5-10 times with each hand. Thumb motion  Sit with your forearm resting on a table and your wrist straight. Your thumb should be facing up toward the ceiling. Keep your fingers relaxed as you move your thumb. Lift your thumb up as high as you can toward the ceiling. Hold for 10 seconds. Bend your thumb across your palm as far as you can, reaching the tip of your thumb for the small finger (pinkie) side of your palm. Hold for 10 seconds. Repeat this exercise 5-10  times with each hand. Grip strengthening  Hold a stress ball or other soft ball in the middle of your hand. Slowly increase the pressure, squeezing the ball as much as you can without causing pain. Think of bringing the tips of your fingers into the middle of your palm. All of your finger joints should bend when doing this exercise. Hold your squeeze for 10 seconds, then relax. Repeat this exercise 5-10 times with each hand. Contact a health care provider if: Your hand pain or discomfort gets much worse when you do  an exercise. Your hand pain or discomfort does not improve within 2 hours after you exercise. If you have either of these problems, stop doing these exercises right away. Do not do them again unless your provider says that you can. Get help right away if: You develop sudden, severe hand pain or swelling. If this happens, stop doing these exercises right away. Do not do them again unless your provider says that you can. This information is not intended to replace advice given to you by your health care provider. Make sure you discuss any questions you have with your health care provider. Document Revised: 04/12/2022 Document Reviewed: 04/12/2022 [employer] Patient Education  2024 [employer]Shoulder Exercises Ask your health care provider which exercises are safe for you. Do exercises exactly as told by your health care provider and adjust them as directed. It is normal to feel mild stretching, pulling, tightness, or discomfort as you do these exercises. Stop right away if you feel sudden pain or your pain gets worse. Do not begin these exercises until told by your health care provider. Stretching exercises External rotation and abduction This exercise is sometimes called corner stretch. The exercise rotates your arm outward (external rotation) and moves your arm out from your body (abduction). Stand in a doorway with one of your feet slightly in front of the other. This is called a  staggered stance. If you cannot reach your forearms to the door frame, stand facing a corner of a room. Choose one of the following positions as told by your health care provider: Place your hands and forearms on the door frame above your head. Place your hands and forearms on the door frame at the height of your head. Place your hands on the door frame at the height of your elbows. Slowly move your weight onto your front foot until you feel a stretch across your chest and in the front of your shoulders. Keep your head and chest upright and keep your abdominal muscles tight. Hold for __________ seconds. To release the stretch, shift your weight to your back foot. Repeat __________ times. Complete this exercise __________ times a day. Extension, standing  Stand and hold a broomstick, a cane, or a similar object behind your back. Your hands should be a little wider than shoulder-width apart. Your palms should face away from your back. Keeping your elbows straight and your shoulder muscles relaxed, move the stick away from your body until you feel a stretch in your shoulders (extension). Avoid shrugging your shoulders while you move the stick. Keep your shoulder blades tucked down toward the middle of your back. Hold for __________ seconds. Slowly return to the starting position. Repeat __________ times. Complete this exercise __________ times a day. Range-of-motion exercises Pendulum  Stand near a wall or a surface that you can hold onto for balance. Bend at the waist and let your left / right arm hang straight down. Use your other arm to support you. Keep your back straight and do not lock your knees. Relax your left / right arm and shoulder muscles, and move your hips and your trunk so your left / right arm swings freely. Your arm should swing because of the motion of your body, not because you are using your arm or shoulder muscles. Keep moving your hips and trunk so your arm swings in the  following directions, as told by your health care provider: Side to side. Forward and backward. In clockwise and counterclockwise circles. Continue each motion for __________  seconds, or for as long as told by your health care provider. Slowly return to the starting position. Repeat __________ times. Complete this exercise __________ times a day. Shoulder flexion, standing  Stand and hold a broomstick, a cane, or a similar object. Place your hands a little more than shoulder-width apart on the object. Your left / right hand should be palm-up, and your other hand should be palm-down. Keep your elbow straight and your shoulder muscles relaxed. Push the stick up with your healthy arm to raise your left / right arm in front of your body, and then over your head until you feel a stretch in your shoulder (flexion). Avoid shrugging your shoulder while you raise your arm. Keep your shoulder blade tucked down toward the middle of your back. Hold for __________ seconds. Slowly return to the starting position. Repeat __________ times. Complete this exercise __________ times a day. Shoulder abduction, standing  Stand and hold a broomstick, a cane, or a similar object. Place your hands a little more than shoulder-width apart on the object. Your left / right hand should be palm-up, and your other hand should be palm-down. Keep your elbow straight and your shoulder muscles relaxed. Push the object across your body toward your left / right side. Raise your left / right arm to the side of your body (abduction) until you feel a stretch in your shoulder. Do not raise your arm above shoulder height unless your health care provider tells you to do that. If directed, raise your arm over your head. Avoid shrugging your shoulder while you raise your arm. Keep your shoulder blade tucked down toward the middle of your back. Hold for __________ seconds. Slowly return to the starting position. Repeat __________ times.  Complete this exercise __________ times a day. Internal rotation  Place your left / right hand behind your back, palm-up. Use your other hand to dangle an exercise band, a broomstick, or a similar object over your shoulder. Grasp the band with your left / right hand so you are holding on to both ends. Gently pull up on the band until you feel a stretch in the front of your left / right shoulder. The movement of your arm toward the center of your body is called internal rotation. Avoid shrugging your shoulder while you raise your arm. Keep your shoulder blade tucked down toward the middle of your back. Hold for __________ seconds. Release the stretch by letting go of the band and lowering your hands. Repeat __________ times. Complete this exercise __________ times a day. Strengthening exercises External rotation  Sit in a stable chair without armrests. Secure an exercise band to a stable object at elbow height on your left / right side. Place a soft object, such as a folded towel or a small pillow, between your left / right upper arm and your body to move your elbow about 4 inches (10 cm) away from your side. Hold the end of the exercise band so it is tight and there is no slack. Keeping your elbow pressed against the soft object, slowly move your forearm out, away from your abdomen (external rotation). Keep your body steady so only your forearm moves. Hold for __________ seconds. Slowly return to the starting position. Repeat __________ times. Complete this exercise __________ times a day. Shoulder abduction  Sit in a stable chair without armrests, or stand up. Hold a __________ lb / kg weight in your left / right hand, or hold an exercise band with both hands.  Start with your arms straight down and your left / right palm facing in, toward your body. Slowly lift your left / right hand out to your side (abduction). Do not lift your hand above shoulder height unless your health care provider  tells you that this is safe. Keep your arms straight. Avoid shrugging your shoulder while you do this movement. Keep your shoulder blade tucked down toward the middle of your back. Hold for __________ seconds. Slowly lower your arm, and return to the starting position. Repeat __________ times. Complete this exercise __________ times a day. Shoulder extension  Sit in a stable chair without armrests, or stand up. Secure an exercise band to a stable object in front of you so it is at shoulder height. Hold one end of the exercise band in each hand. Straighten your elbows and lift your hands up to shoulder height. Squeeze your shoulder blades together as you pull your hands down to the sides of your thighs (extension). Stop when your hands are straight down by your sides. Do not let your hands go behind your body. Hold for __________ seconds. Slowly return to the starting position. Repeat __________ times. Complete this exercise __________ times a day. Shoulder row  Sit in a stable chair without armrests, or stand up. Secure an exercise band to a stable object in front of you so it is at chest height. Hold one end of the exercise band in each hand. Position your palms so that your thumbs are facing the ceiling (neutral position). Bend each of your elbows to a 90-degree angle (right angle) and keep your upper arms at your sides. Step back or move the chair back until the band is tight and there is no slack. Slowly pull your elbows back behind you. Hold for __________ seconds. Slowly return to the starting position. Repeat __________ times. Complete this exercise __________ times a day. Shoulder press-ups  Sit in a stable chair that has armrests. Sit upright, with your feet flat on the floor. Put your hands on the armrests so your elbows are bent and your fingers are pointing forward. Your hands should be about even with the sides of your body. Push down on the armrests and use your arms to  lift yourself off the chair. Straighten your elbows and lift yourself up as much as you comfortably can. Move your shoulder blades down, and avoid letting your shoulders move up toward your ears. Keep your feet on the ground. As you get stronger, your feet should support less of your body weight as you lift yourself up. Hold for __________ seconds. Slowly lower yourself back into the chair. Repeat __________ times. Complete this exercise __________ times a day. Wall push-ups  Stand so you are facing a stable wall. Your feet should be about one arm-length away from the wall. Lean forward and place your palms on the wall at shoulder height. Keep your feet flat on the floor as you bend your elbows and lean forward toward the wall. Hold for __________ seconds. Straighten your elbows to push yourself back to the starting position. Repeat __________ times. Complete this exercise __________ times a day. This information is not intended to replace advice given to you by your health care provider. Make sure you discuss any questions you have with your health care provider. Document Revised: 05/18/2021 Document Reviewed: 05/18/2021 [employer] Patient Education  2024 [employer].

## 2024-03-24 ENCOUNTER — Ambulatory Visit: Payer: Self-pay | Admitting: Rheumatology

## 2024-03-25 LAB — ANTI-DNA ANTIBODY, DOUBLE-STRANDED: ds DNA Ab: <1 [IU]/mL

## 2024-03-25 LAB — RHEUMATOID FACTOR: Rheumatoid fact SerPl-aCnc: <10 [IU]/mL (ref <14)

## 2024-03-25 LAB — ANTI-SMITH ANTIBODY: ENA SM Ab Ser-aCnc: <1 AI

## 2024-03-25 LAB — PROTEIN / CREATININE RATIO, URINE
Creatinine, Urine: 61 mg/dL (ref 20–275)
Total Protein, Urine: <4 mg/dL — ABNORMAL LOW (ref 5–24)

## 2024-03-25 LAB — BETA-2 GLYCOPROTEIN ANTIBODIES
Beta-2 Glyco 1 IgA: <2 U/mL (ref <20.0)
Beta-2 Glyco 1 IgM: 2.1 U/mL (ref <20.0)
Beta-2 Glyco I IgG: <2 U/mL (ref <20.0)

## 2024-03-25 LAB — ANTI-SCLERODERMA ANTIBODY: Scleroderma (Scl-70) (ENA) Antibody, IgG: <1 AI

## 2024-03-25 LAB — RNP ANTIBODY: Ribonucleic Protein(ENA) Antibody, IgG: <1 AI

## 2024-03-25 LAB — URIC ACID: Uric Acid, Serum: 3.6 mg/dL (ref 2.5–7.0)

## 2024-03-25 LAB — ANTI-NUCLEAR AB-TITER (ANA TITER): ANA Titer 1: 1:640 {titer} — ABNORMAL HIGH

## 2024-03-25 LAB — CARDIOLIPIN ANTIBODIES, IGG, IGM, IGA
Anticardiolipin IgA: <2 [APL'U]/mL (ref <20.0)
Anticardiolipin IgG: <2 [GPL'U]/mL (ref <20.0)
Anticardiolipin IgM: 2.7 [MPL'U]/mL (ref <20.0)

## 2024-03-25 LAB — C3 AND C4
C3 Complement: 112 mg/dL (ref 83–193)
C4 Complement: 23 mg/dL (ref 15–57)

## 2024-03-25 LAB — CYCLIC CITRUL PEPTIDE ANTIBODY, IGG: Cyclic Citrullin Peptide Ab: <16 U

## 2024-03-25 LAB — SJOGRENS SYNDROME-A EXTRACTABLE NUCLEAR ANTIBODY: SSA (Ro) (ENA) Antibody, IgG: <1 AI

## 2024-03-25 LAB — SEDIMENTATION RATE: Sed Rate: 6 mm/h (ref 0–30)

## 2024-03-25 LAB — SJOGRENS SYNDROME-B EXTRACTABLE NUCLEAR ANTIBODY: SSB (La) (ENA) Antibody, IgG: <1 AI

## 2024-03-25 LAB — ANA: Anti Nuclear Antibody (ANA): POSITIVE — AB

## 2024-04-12 NOTE — Progress Notes (Signed)
 "  Office Visit Note  Patient: Tammy Evans             Date of Birth: [birth year]           MRN: [id number]             PCP: Glenard Mire, MD Referring: Sowles, Krichna, MD Visit Date: 04/25/2024 Occupation: Data Unavailable  Subjective:  Pain in multiple joints and positive ANA  History of Present Illness: Tammy Evans is a 54 y.o. female with positive ANA and polyarthralgia.  She returns today after initial visit on March 21, 2024.  She states she continues to have some discomfort in her lower extremities which she describes as muscle spasms.  She continues to have some discomfort in her hands, knees and feet.  She has not had any shoulder joint discomfort recently.  She has been taking vitamin D .  There is no history of oral ulcers, nasal ulcers, malar rash, photosensitivity, Raynaud's, lymphadenopathy or inflammatory arthritis.    Activities of Daily Living:  Patient reports morning stiffness for 10-15 minutes.   Patient Reports nocturnal pain.  Difficulty dressing/grooming: Reports Difficulty climbing stairs: Denies Difficulty getting out of chair: Denies Difficulty using hands for taps, buttons, cutlery, and/or writing: Reports  Review of Systems  Constitutional:  Negative for fatigue.  HENT:  Negative for mouth sores and mouth dryness.   Eyes:  Negative for dryness.  Respiratory:  Negative for shortness of breath.   Cardiovascular:  Negative for chest pain and palpitations.  Gastrointestinal:  Negative for blood in stool, constipation and diarrhea.  Endocrine: Negative for increased urination.  Genitourinary:  Negative for involuntary urination.  Musculoskeletal:  Positive for joint pain, joint pain and morning stiffness. Negative for gait problem, joint swelling, myalgias, muscle weakness, muscle tenderness and myalgias.  Skin:  Negative for color change, rash, hair loss and sensitivity to sunlight.  Allergic/Immunologic: Negative for susceptible to  infections.  Neurological:  Positive for dizziness and headaches.  Hematological:  Negative for swollen glands.  Psychiatric/Behavioral:  Negative for depressed mood and sleep disturbance. The patient is not nervous/anxious.     PMFS History:  Patient Active Problem List   Diagnosis Date Noted   Cervical high risk HPV (human papillomavirus) test positive 02/19/2024   Positive ANA (antinuclear antibody) 09/20/2023   Lymphocytopenia 08/22/2023   Hyperbilirubinemia 08/22/2023   B12 deficiency 08/16/2023   Tension headache 08/16/2023   Hypercholesterolemia 08/16/2023   Menopausal vasomotor syndrome 12/08/2021   Vitamin D  deficiency 12/08/2021   Cervical polyp 01/29/2021   Knee pain, left 12/06/2019   Pre-diabetes 01/01/2018   Major depression in remission 12/27/2017   Other neutropenia 08/08/2016   Genital herpes 08/08/2016   GAD (generalized anxiety disorder) 11/10/2015   Migraine without aura and responsive to treatment 01/13/2015   Other insomnia 01/13/2015    Past Medical History:  Diagnosis Date   Chronic insomnia    Headache, common migraine    4-5 x/yr   Menopausal state     Family History  Problem Relation Age of Onset   Hypertension Mother    Diabetes Mother    Hyperlipidemia Mother    Diabetes Father    Heart disease Father    Hypertension Father    Drug abuse Sister    Stroke Sister 50   Heart murmur Brother    Stroke Paternal Grandmother    Sickle cell trait Daughter    Healthy Son    Arthritis Paternal Aunt    Breast cancer Neg  Hx    Past Surgical History:  Procedure Laterality Date   CESAREAN SECTION  04/11/88   COLONOSCOPY WITH PROPOFOL  N/A 02/28/2020   Procedure: COLONOSCOPY WITH PROPOFOL ;  Surgeon: Jinny Carmine, MD;  Location: [facility];  Service: Endoscopy;  Laterality: N/A;  priority 4   culposcopy     TUBAL LIGATION     Social History[1] Social History   Social History Narrative   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   She  is living alone now.  She has grown children from previous relationship     Immunization History  Administered Date(s) Administered   Janssen (J&J) SARS-COV-2 Vaccination 10/01/2019   Tdap 04/11/2009, 12/09/2019   Zoster Recombinant(Shingrix) 01/29/2021, 05/07/2021     Objective: Vital Signs: BP (!) 151/102   Pulse 66   Temp 97.9 F (36.6 C)   Resp 14   Ht 5' 5.75 (1.67 m)   Wt 162 lb 4.8 oz (73.6 kg)   BMI 26.40 kg/m    Physical Exam Vitals and nursing note reviewed.  Constitutional:      Appearance: She is well-developed.  HENT:     Head: Normocephalic and atraumatic.  Eyes:     Conjunctiva/sclera: Conjunctivae normal.  Cardiovascular:     Rate and Rhythm: Normal rate and regular rhythm.     Heart sounds: Normal heart sounds.  Pulmonary:     Effort: Pulmonary effort is normal.     Breath sounds: Normal breath sounds.  Abdominal:     General: Bowel sounds are normal.     Palpations: Abdomen is soft.  Musculoskeletal:     Cervical back: Normal range of motion.  Lymphadenopathy:     Cervical: No cervical adenopathy.  Skin:    General: Skin is warm and dry.     Capillary Refill: Capillary refill takes less than 2 seconds.  Neurological:     Mental Status: She is alert and oriented to person, place, and time.  Psychiatric:        Behavior: Behavior normal.      Musculoskeletal Exam: Cervical, thoracic and lumbar spine were in good range of motion.  There was no SI joint tenderness.  Shoulder joints, elbow joints, wrist joints, MCPs, PIPs and DIPs were in good range of motion with no synovitis.  Hip joints and knee joints were in good range of motion without any warmth swelling or effusion.  There was no tenderness over ankles or MTPs.   CDAI Exam: CDAI Score: -- Patient Global: --; Provider Global: -- Swollen: --; Tender: -- Joint Exam 04/25/2024   No joint exam has been documented for this visit   There is currently no information documented on the  homunculus. Go to the Rheumatology activity and complete the homunculus joint exam.  Investigation: No additional findings.  Imaging: No results found.  Recent Labs: Lab Results  Component Value Date   WBC 4.3 02/07/2024   HGB 13.0 02/07/2024   PLT 162 02/07/2024   NA 139 02/07/2024   K 4.0 02/07/2024   CL 103 02/07/2024   CO2 28 02/07/2024   GLUCOSE 84 02/07/2024   BUN 14 02/07/2024   CREATININE 1.02 02/07/2024   BILITOT 1.1 02/07/2024   ALKPHOS 89 08/22/2023   AST 19 02/07/2024   ALT 15 02/07/2024   PROT 7.0 02/07/2024   ALBUMIN 4.6 08/22/2023   CALCIUM  9.4 02/07/2024   GFRAA 76 12/09/2019   March 21, 2024 ANA 1: 640 NS, ENA (RNP, Smith, SSA, SSB, dsDNA, SCL 70)  negative, C3-C4 normal, anticardiolipin negative, beta-2  GP 1 negative, urine protein creatinine ratio normal, RF negative, anti-CCP negative, sed rate 6, uric acid 3.6  Speciality Comments: No specialty comments available.  Procedures:  No procedures performed Allergies: Macrobid [nitrofurantoin]   Assessment / Plan:     Visit Diagnoses: Positive ANA (antinuclear antibody) - March 21, 2024 ANA 1: 640 NS, ENA (RNP, Smith, SSA, SSB, dsDNA, SCL 70) negative, C3-C4 normal, anticardiolipin negative, beta-2  GP 1 negative, urine protein creatinine ratio normal, RF negative, anti-CCP negative, sed rate 6, uric acid 3.6.  Lab results were discussed with the patient at length.  I advised her to contact me if she develops any new symptoms.  She denies any history of oral ulcers, nasal ulcers, malar rash, photosensitivity, Raynaud's, lymphadenopathy or inflammatory arthritis.  There are no clinical features of lupus or related disease.  Chronic pain of both shoulders - History of pain in shoulders for many years.  X-rays of the right shoulder joint were obtained at the last visit were unremarkable.  X-rays were reviewed with the patient.  She states shoulder joint pain has improved.  Pain in both hands - Ongoing pain  for the last 10 years with decreased grip strength per patient.  No synovitis noted.  X-rays obtained at the last visit were unremarkable.  X-rays were reviewed with the patient.  Joint protection muscle strengthening was discussed.  A handout on hand exercises was given.  Patellofemoral pain syndrome of both knees -he continues to have knee joint discomfort.  She also has popping sensation in her knees.  She probably has patellofemoral syndrome.  I will refer her to physical therapy.  Plan: Ambulatory referral to Physical Therapy  Chronic pain of both knees - History of pain in both knee joints for the last 15 years.  She has had cortisone injection to the right knee in the past.  X-rays obtained were unremarkable.  X-ray findings were reviewed with the patient.  She also gives history of muscle cramps in her lower extremities.  Use of magnesium glycinate at bedtime was discussed.  Stretching exercises were discussed.  Pain in left foot - History of left heel pain and difficulty walking.  X-rays obtained at the last visit were unremarkable.   Other medical problems are listed as follows:  Vitamin D  deficiency  B12 deficiency  Hypercholesterolemia  Pre-diabetes  Other insomnia  Genital herpes simplex, unspecified site  Migraine without aura and responsive to treatment  Anxiety and depression  Orders: Orders Placed This Encounter  Procedures   Ambulatory referral to Physical Therapy   No orders of the defined types were placed in this encounter.   Follow-Up Instructions: Return in about 1 year (around 04/25/2025) for +ANA.   Maya Nash, MD  Note - This record has been created using Animal nutritionist.  Chart creation errors have been sought, but may not always  have been located. Such creation errors do not reflect on  the standard of medical care.     [1]  Social History Tobacco Use   Smoking status: Never    Passive exposure: Past   Smokeless tobacco: Never   Vaping Use   Vaping status: Never Used  Substance Use Topics   Alcohol use: Yes    Alcohol/week: 0.0 standard drinks of alcohol    Comment: occasionally (1x/month)   Drug use: No   "

## 2024-04-25 ENCOUNTER — Ambulatory Visit: Attending: Rheumatology | Admitting: Rheumatology

## 2024-04-25 ENCOUNTER — Encounter: Payer: Self-pay | Admitting: Rheumatology

## 2024-04-25 VITALS — BP 151/102 | HR 66 | Temp 97.9°F | Resp 14 | Ht 65.75 in | Wt 162.3 lb

## 2024-04-25 DIAGNOSIS — E538 Deficiency of other specified B group vitamins: Secondary | ICD-10-CM | POA: Diagnosis not present

## 2024-04-25 DIAGNOSIS — G8929 Other chronic pain: Secondary | ICD-10-CM

## 2024-04-25 DIAGNOSIS — M79641 Pain in right hand: Secondary | ICD-10-CM

## 2024-04-25 DIAGNOSIS — R7303 Prediabetes: Secondary | ICD-10-CM

## 2024-04-25 DIAGNOSIS — E559 Vitamin D deficiency, unspecified: Secondary | ICD-10-CM | POA: Diagnosis not present

## 2024-04-25 DIAGNOSIS — R7689 Other specified abnormal immunological findings in serum: Secondary | ICD-10-CM

## 2024-04-25 DIAGNOSIS — F419 Anxiety disorder, unspecified: Secondary | ICD-10-CM

## 2024-04-25 DIAGNOSIS — M25512 Pain in left shoulder: Secondary | ICD-10-CM

## 2024-04-25 DIAGNOSIS — E7800 Pure hypercholesterolemia, unspecified: Secondary | ICD-10-CM | POA: Diagnosis not present

## 2024-04-25 DIAGNOSIS — M79642 Pain in left hand: Secondary | ICD-10-CM

## 2024-04-25 DIAGNOSIS — A6000 Herpesviral infection of urogenital system, unspecified: Secondary | ICD-10-CM

## 2024-04-25 DIAGNOSIS — G43009 Migraine without aura, not intractable, without status migrainosus: Secondary | ICD-10-CM

## 2024-04-25 DIAGNOSIS — M25561 Pain in right knee: Secondary | ICD-10-CM | POA: Diagnosis not present

## 2024-04-25 DIAGNOSIS — G4709 Other insomnia: Secondary | ICD-10-CM | POA: Diagnosis not present

## 2024-04-25 DIAGNOSIS — F32A Depression, unspecified: Secondary | ICD-10-CM

## 2024-04-25 DIAGNOSIS — M25562 Pain in left knee: Secondary | ICD-10-CM

## 2024-04-25 DIAGNOSIS — M222X1 Patellofemoral disorders, right knee: Secondary | ICD-10-CM | POA: Diagnosis not present

## 2024-04-25 DIAGNOSIS — M79672 Pain in left foot: Secondary | ICD-10-CM

## 2024-04-25 DIAGNOSIS — M25511 Pain in right shoulder: Secondary | ICD-10-CM

## 2024-04-25 DIAGNOSIS — M222X2 Patellofemoral disorders, left knee: Secondary | ICD-10-CM

## 2024-04-25 NOTE — Patient Instructions (Addendum)
 Exercises for Chronic Knee Pain Chronic knee pain is pain that lasts longer than 3 months. For most people with chronic knee pain, exercise and weight loss is an important part of treatment. Your health care provider may want you to focus on: Making the muscles that support your knee stronger. This can take pressure off your knee and reduce pain. Preventing knee stiffness. How far you can move your knee, keeping it there or making it farther. Losing weight (if this applies) to take pressure off your knee, lower your risk for injury, and make it easier for you to exercise. Your provider will help you make an exercise program that fits your needs and physical abilities. Below are simple, low-impact exercises you can do at home. Ask your provider or physical therapist how often you should do your exercise program and how many times to repeat each exercise. General safety tips  Get your provider's approval before doing any exercises. Start slowly and stop any time you feel pain. Do not exercise if your knee pain is flaring up. Warm up first. Stretching a cold muscle can cause an injury. Do 5-10 minutes of easy movement or light stretching before beginning your exercises. Do 5-10 minutes of low-impact activity (like walking or cycling) before starting strengthening exercises. Contact your provider any time you have pain during or after exercising. Exercise can cause discomfort but should not be painful. It is normal to be a little stiff or sore after exercising. Stretching and range-of-motion exercises Front thigh stretch  Stand up straight and support your body by holding on to a chair or resting one hand on a wall. With your legs straight and close together, bend one knee to lift your heel up toward your butt. Using one hand for support, grab your ankle with your free hand. Pull your foot up closer toward your butt to feel the stretch in front of your thigh. Hold the stretch for 30  seconds. Repeat __________ times. Complete this exercise __________ times a day. Back thigh stretch  Sit on the floor with your back straight and your legs out straight in front of you. Place the palms of your hands on the floor and slide them toward your feet as you bend at the hip. Try to touch your nose to your knees and feel the stretch in the back of your thighs. Hold for 30 seconds. Repeat __________ times. Complete this exercise __________ times a day. Calf stretch  Stand facing a wall. Place the palms of your hands flat against the wall, arms extended, and lean slightly against the wall. Get into a lunge position with one leg bent at the knee and the other leg stretched out straight behind you. Keep both feet facing the wall and increase the bend in your knee while keeping the heel of the other leg flat on the ground. You should feel the stretch in your calf. Hold for 30 seconds. Repeat __________ times. Complete this exercise __________ times a day. Strengthening exercises Straight leg lift  Lie on your back with one knee bent and the other leg out straight. Slowly lift the straight leg without bending the knee. Lift until your foot is about 12 inches (30 cm) off the floor. Hold for 3-5 seconds and slowly lower your leg. Repeat __________ times. Complete this exercise __________ times a day. Single leg dip  Stand between two chairs and put both hands on the backs of the chairs for support. Extend one leg out straight with your body  weight resting on the heel of the standing leg. Slowly bend your standing knee to dip your body to the level that is comfortable for you. Hold for 3-5 seconds. Repeat __________ times. Complete this exercise __________ times a day. Hamstring curls  Stand straight, knees close together, facing the back of a chair. Hold on to the back of a chair with both hands. Keep one leg straight. Bend the other knee while bringing the heel up toward the butt  until the knee is bent at a 90-degree angle (right angle). Hold for 3-5 seconds. Repeat __________ times. Complete this exercise __________ times a day. Wall squat  Stand straight with your back, hips, and head against a wall. Step forward one foot at a time with your back still against the wall. Your feet should be 2 feet (61 cm) from the wall at shoulder width. Keeping your back, hips, and head against the wall, slide down the wall to as close to a sitting position as you can get. Hold for 5-10 seconds, then slowly slide back up. Repeat __________ times. Complete this exercise __________ times a day. Step-ups  Stand in front of a sturdy platform or stool that is about 6 inches (15 cm) high. Slowly step up with your left / right foot, keeping your knee in line with your hip and foot. Do not let your knee bend so far that you cannot see your toes. Hold on to a chair for balance, but do not use it for support. Slowly unlock your knee and lower yourself to the starting position. Repeat __________ times. Complete this exercise __________ times a day. Contact a health care provider if: Your exercises cause pain. Your pain is worse after you exercise. Your pain prevents you from doing your exercises. This information is not intended to replace advice given to you by your health care provider. Make sure you discuss any questions you have with your health care provider. Document Revised: 04/12/2022 Document Reviewed: 04/12/2022 Elsevier Patient Education  2024 [employer]Hand Exercises Hand exercises can be helpful for almost anyone. They can strengthen your hands and improve flexibility and movement. The exercises can also increase blood flow to the hands. These results can make your work and daily tasks easier for you. Hand exercises can be especially helpful for people who have joint pain from arthritis or nerve damage from using their hands over and over. These exercises can also help people  who injure a hand. Exercises Most of these hand exercises are gentle stretching and motion exercises. It is usually safe to do them often throughout the day. Warming up your hands before exercise may help reduce stiffness. You can do this with gentle massage or by placing your hands in warm water  for 10-15 minutes. It is normal to feel some stretching, pulling, tightness, or mild discomfort when you begin new exercises. In time, this will improve. Remember to always be careful and stop right away if you feel sudden, very bad pain or your pain gets worse. You want to get better and be safe. Ask your health care provider which exercises are safe for you. Do exercises exactly as told by your provider and adjust them as told. Do not begin these exercises until told by your provider. Knuckle bend or claw fist  Stand or sit with your arm, hand, and all five fingers pointed straight up. Make sure to keep your wrist straight. Gently bend your fingers down toward your palm until the tips of your fingers are  touching your palm. Keep your big knuckle straight and only bend the small knuckles in your fingers. Hold this position for 10 seconds. Straighten your fingers back to your starting position. Repeat this exercise 5-10 times with each hand. Full finger fist  Stand or sit with your arm, hand, and all five fingers pointed straight up. Make sure to keep your wrist straight. Gently bend your fingers into your palm until the tips of your fingers are touching the middle of your palm. Hold this position for 10 seconds. Extend your fingers back to your starting position, stretching every joint fully. Repeat this exercise 5-10 times with each hand. Straight fist  Stand or sit with your arm, hand, and all five fingers pointed straight up. Make sure to keep your wrist straight. Gently bend your fingers at the big knuckle, where your fingers meet your hand, and at the middle knuckle. Keep the knuckle at the  tips of your fingers straight and try to touch the bottom of your palm. Hold this position for 10 seconds. Extend your fingers back to your starting position, stretching every joint fully. Repeat this exercise 5-10 times with each hand. Tabletop  Stand or sit with your arm, hand, and all five fingers pointed straight up. Make sure to keep your wrist straight. Gently bend your fingers at the big knuckle, where your fingers meet your hand, as far down as you can. Keep the small knuckles in your fingers straight. Think of forming a tabletop with your fingers. Hold this position for 10 seconds. Extend your fingers back to your starting position, stretching every joint fully. Repeat this exercise 5-10 times with each hand. Finger spread  Place your hand flat on a table with your palm facing down. Make sure your wrist stays straight. Spread your fingers and thumb apart from each other as far as you can until you feel a gentle stretch. Hold this position for 10 seconds. Bring your fingers and thumb tight together again. Hold this position for 10 seconds. Repeat this exercise 5-10 times with each hand. Making circles  Stand or sit with your arm, hand, and all five fingers pointed straight up. Make sure to keep your wrist straight. Make a circle by touching the tip of your thumb to the tip of your index finger. Hold for 10 seconds. Then open your hand wide. Repeat this motion with your thumb and each of your fingers. Repeat this exercise 5-10 times with each hand. Thumb motion  Sit with your forearm resting on a table and your wrist straight. Your thumb should be facing up toward the ceiling. Keep your fingers relaxed as you move your thumb. Lift your thumb up as high as you can toward the ceiling. Hold for 10 seconds. Bend your thumb across your palm as far as you can, reaching the tip of your thumb for the small finger (pinkie) side of your palm. Hold for 10 seconds. Repeat this exercise 5-10  times with each hand. Grip strengthening  Hold a stress ball or other soft ball in the middle of your hand. Slowly increase the pressure, squeezing the ball as much as you can without causing pain. Think of bringing the tips of your fingers into the middle of your palm. All of your finger joints should bend when doing this exercise. Hold your squeeze for 10 seconds, then relax. Repeat this exercise 5-10 times with each hand. Contact a health care provider if: Your hand pain or discomfort gets much worse when you do  an exercise. Your hand pain or discomfort does not improve within 2 hours after you exercise. If you have either of these problems, stop doing these exercises right away. Do not do them again unless your provider says that you can. Get help right away if: You develop sudden, severe hand pain or swelling. If this happens, stop doing these exercises right away. Do not do them again unless your provider says that you can. This information is not intended to replace advice given to you by your health care provider. Make sure you discuss any questions you have with your health care provider. Document Revised: 04/12/2022 Document Reviewed: 04/12/2022 Elsevier Patient Education  2024 [employer] Inc. Exercises for Plantar Fasciitis Foot and leg exercises can help if you have plantar fasciitis. Only do the exercises you were told to do. Make sure you know how to do the exercises safely. Follow the steps below. It's normal to feel mild discomfort. Stop if you feel pain or your pain gets worse. Do not start these exercises until told by your health care provider. Stretching and range-of-motion exercises These exercises warm up your muscles and joints. They also help with movement and flexibility of your foot. They can help with pain. Plantar fascia stretch This exercise will stretch your plantar fascia, which is a band of thick tissue on the bottom of your foot. Sit with your left / right leg  crossed over your other knee. Hold your heel with one hand with that thumb near your arch. With your other hand, hold your toes. Gently pull your toes back toward the top of your foot. You should feel a stretch on the bottom of your toes, on the bottom of your foot, or both. Hold this stretch for __________ seconds. Slowly let go of your toes. Go back to the starting position. Repeat __________ times. Do this exercise __________ times a day. Gastroc stretch, standing This exercise is called an upper calf, or gastroc, stretch. It stretches the muscles in the back of your upper calf. Stand with your hands against a wall. Extend your left / right leg behind you. Bend your front knee just a little. Keep your heels on the floor, your toes facing forward, and your back knee straight. Shift your weight toward the wall. Do not arch your back. You should feel a gentle stretch in your upper calf. Hold this position for __________ seconds. Repeat __________ times. Do this exercise __________ times a day. Soleus stretch, standing This exercise is called a lower calf, or soleus, stretch. It stretches the muscles in the back of your lower calf. Stand with your hands against a wall. Extend your left / right leg behind you, and bend your front knee slightly. Keep your heels on the floor and your toes facing forward. Bend your back knee and shift your weight slightly over your back leg. You should feel a gentle stretch deep in your lower calf. Hold this position for __________ seconds. Repeat __________ times. Do this exercise __________ times a day. Gastroc and soleus stretch, standing step This exercise stretches the muscles in the back of your lower leg. This includes your gastroc and soleus muscles. Stand with the ball of your left / right foot on the front of a step. The ball of your foot is on the walking surface, right under your toes. Keep your other foot firmly on the same step. Hold on to the wall or  a railing for balance. Slowly lift your other foot, letting your body  weight press your heel down over the edge of the front of the step. Keep your knee straight and unbent. You should feel a stretch in your calf. Hold this position for __________ seconds. Return both feet to the step. Repeat this exercise with a slight bend in your left / right knee. Repeat __________ times with your left / right knee straight and __________ times with your left / right knee bent. Do this exercise __________ times a day. Balance exercise This exercise builds your balance and strength control of your arch. It helps take pressure off your plantar fascia. Single leg stand If this exercise is too easy, you can try it with your eyes closed or while standing on a pillow. Without shoes, stand near a railing or in a doorway. You may hold on to the railing or doorway as needed. Stand on your left / right foot. Keep your big toe down on the floor. Lift the arch of your foot. You should feel a stretch across the bottom of your foot and arch. Do not let your foot roll inward. Hold this position for __________ seconds. Repeat __________ times. Do this exercise __________ times a day. This information is not intended to replace advice given to you by your health care provider. Make sure you discuss any questions you have with your health care provider. Document Revised: 08/29/2022 Document Reviewed: 08/29/2022 Elsevier Patient Education  2024 [employer].

## 2024-05-16 ENCOUNTER — Other Ambulatory Visit: Payer: Self-pay | Admitting: Family Medicine

## 2024-05-16 DIAGNOSIS — E7800 Pure hypercholesterolemia, unspecified: Secondary | ICD-10-CM

## 2024-05-16 DIAGNOSIS — G4709 Other insomnia: Secondary | ICD-10-CM

## 2024-05-17 NOTE — Telephone Encounter (Signed)
 Requested medication (s) are due for refill today: yes  Requested medication (s) are on the active medication list: yes  Last refill:  02/19/24 #90/0  Future visit scheduled: yes  Notes to clinic:  Unable to refill per protocol due to failed labs, no updated results.      Requested Prescriptions  Pending Prescriptions Disp Refills   rosuvastatin  (CRESTOR ) 20 MG tablet [Pharmacy Med Name: ROSUVASTATIN  CALCIUM  20 MG TAB] 90 tablet 0    Sig: TAKE 1 TABLET BY MOUTH EVERY DAY     Cardiovascular:  Antilipid - Statins 2 Failed - 05/17/2024 12:59 PM      Failed - Lipid Panel in normal range within the last 12 months    Cholesterol, Total  Date Value Ref Range Status  01/15/2015 189 100 - 199 mg/dL Final   Cholesterol  Date Value Ref Range Status  02/03/2023 226 (H) <200 mg/dL Final   LDL Cholesterol (Calc)  Date Value Ref Range Status  02/03/2023 140 (H) mg/dL (calc) Final    Comment:    Reference range: <100 . Desirable range <100 mg/dL for primary prevention;   <70 mg/dL for patients with CHD or diabetic patients  with > or = 2 CHD risk factors. SABRA LDL-C is now calculated using the Martin-Hopkins  calculation, which is a validated novel method providing  better accuracy than the Friedewald equation in the  estimation of LDL-C.  Gladis APPLETHWAITE et al. SANDREA. 7986;689(80): 2061-2068  (http://education.[facility].com/faq/FAQ164)    HDL  Date Value Ref Range Status  02/03/2023 69 > OR = 50 mg/dL Final  89/93/7983 64 >60 mg/dL Final    Comment:    According to ATP-III Guidelines, HDL-C >59 mg/dL is considered a negative risk factor for CHD.    Triglycerides  Date Value Ref Range Status  02/03/2023 76 <150 mg/dL Final         Passed - Cr in normal range and within 360 days    Creat  Date Value Ref Range Status  02/07/2024 1.02 0.50 - 1.03 mg/dL Final   Creatinine, Urine  Date Value Ref Range Status  03/21/2024 61 20 - 275 mg/dL Final         Passed - Patient is  not pregnant      Passed - Valid encounter within last 12 months    Recent Outpatient Visits           2 months ago Pre-diabetes   [facility] [place] Mire, MD   3 months ago Well adult exam   [facility] [place] Mire, MD   9 months ago Hypercholesterolemia   [facility] [place] Mire, MD   9 years ago Dysuria   [facility] at [facility], Harlene BROCKS, MD       Future Appointments             In 11 months Deveshwar, Maya, MD [facility] Rheumatology - A Dept Of [place]. [facility]            Signed Prescriptions Disp Refills   traZODone  (DESYREL ) 50 MG tablet 90 tablet 0    Sig: TAKE 1 TABLET BY MOUTH AT BEDTIME AS NEEDED FOR SLEEP.     Psychiatry: Antidepressants - Serotonin Modulator Passed - 05/17/2024 12:59 PM      Passed - Completed PHQ-2 or PHQ-9 in the last 360 days      Passed - Valid encounter within last 6 months  Recent Outpatient Visits           2 months ago Pre-diabetes   [facility] Glen[place] Mire, MD   3 months ago Well adult exam   [facility] Glenard Mire, MD   9 months ago Hypercholesterolemia   [facility] Glenard Mire, MD   9 years ago Dysuria   Primary Care at [facility], Harlene BROCKS, MD       Future Appointments             In 11 months Deveshwar, Maya, MD [facility] Rheumatology - A Dept Of [place]. [facility]

## 2024-05-17 NOTE — Telephone Encounter (Signed)
 Requested Prescriptions  Pending Prescriptions Disp Refills   traZODone  (DESYREL ) 50 MG tablet [Pharmacy Med Name: TRAZODONE  50 MG TABLET] 90 tablet 1    Sig: TAKE 1 TABLET BY MOUTH AT BEDTIME AS NEEDED FOR SLEEP.     Psychiatry: Antidepressants - Serotonin Modulator Passed - 05/17/2024 12:59 PM      Passed - Completed PHQ-2 or PHQ-9 in the last 360 days      Passed - Valid encounter within last 6 months    Recent Outpatient Visits           2 months ago Pre-diabetes   [facility] Glen[place] Mire, MD   3 months ago Well adult exam   [facility] Glenard Mire, MD   9 months ago Hypercholesterolemia   [facility] Glenard Mire, MD   9 years ago Dysuria   Primary Care at [facility], Harlene BROCKS, MD       Future Appointments             In 11 months Deveshwar, Maya, MD [facility] Rheumatology - A Dept Of [place]. [facility]             rosuvastatin  (CRESTOR ) 20 MG tablet [Pharmacy Med Name: ROSUVASTATIN  CALCIUM  20 MG TAB] 90 tablet 0    Sig: TAKE 1 TABLET BY MOUTH EVERY DAY     Cardiovascular:  Antilipid - Statins 2 Failed - 05/17/2024 12:59 PM      Failed - Lipid Panel in normal range within the last 12 months    Cholesterol, Total  Date Value Ref Range Status  01/15/2015 189 100 - 199 mg/dL Final   Cholesterol  Date Value Ref Range Status  02/03/2023 226 (H) <200 mg/dL Final   LDL Cholesterol (Calc)  Date Value Ref Range Status  02/03/2023 140 (H) mg/dL (calc) Final    Comment:    Reference range: <100 . Desirable range <100 mg/dL for primary prevention;   <70 mg/dL for patients with CHD or diabetic patients  with > or = 2 CHD risk factors. SABRA LDL-C is now calculated using the Martin-Hopkins  calculation, which is a validated novel method providing  better accuracy than the Friedewald equation in the  estimation of LDL-C.  Gladis APPLETHWAITE et al. SANDREA. 7986;689(80):  2061-2068  (http://education.[facility].com/faq/FAQ164)    HDL  Date Value Ref Range Status  02/03/2023 69 > OR = 50 mg/dL Final  89/93/7983 64 >60 mg/dL Final    Comment:    According to ATP-III Guidelines, HDL-C >59 mg/dL is considered a negative risk factor for CHD.    Triglycerides  Date Value Ref Range Status  02/03/2023 76 <150 mg/dL Final         Passed - Cr in normal range and within 360 days    Creat  Date Value Ref Range Status  02/07/2024 1.02 0.50 - 1.03 mg/dL Final   Creatinine, Urine  Date Value Ref Range Status  03/21/2024 61 20 - 275 mg/dL Final         Passed - Patient is not pregnant      Passed - Valid encounter within last 12 months    Recent Outpatient Visits           2 months ago Pre-diabetes   [facility] Glenard Mire, MD   3 months ago Well adult exam   [facility] Glenard Mire, MD   9 months ago  Hypercholesterolemia   [facility] Glenard Mire, MD   9 years ago Dysuria   Primary Care at [facility], Harlene BROCKS, MD       Future Appointments             In 11 months Deveshwar, Maya, MD [facility] Rheumatology - A Dept Of [place]. [facility]

## 2024-05-27 ENCOUNTER — Ambulatory Visit: Admitting: Family Medicine

## 2025-02-11 ENCOUNTER — Encounter: Admitting: Family Medicine

## 2025-04-24 ENCOUNTER — Ambulatory Visit: Admitting: Rheumatology
